# Patient Record
Sex: Female | Born: 2000 | Race: White | Hispanic: No | Marital: Single | Smoking: Current some day smoker
Health system: Southern US, Community
[De-identification: ages and names within clinical notes are randomized; demographics above are authoritative.]

## PROBLEM LIST (undated history)

## (undated) DIAGNOSIS — F419 Anxiety disorder, unspecified: Secondary | ICD-10-CM

## (undated) DIAGNOSIS — F909 Attention-deficit hyperactivity disorder, unspecified type: Secondary | ICD-10-CM

## (undated) DIAGNOSIS — K219 Gastro-esophageal reflux disease without esophagitis: Secondary | ICD-10-CM

## (undated) DIAGNOSIS — T7840XA Allergy, unspecified, initial encounter: Secondary | ICD-10-CM

## (undated) DIAGNOSIS — F3181 Bipolar II disorder: Secondary | ICD-10-CM

## (undated) HISTORY — PX: ADENOIDECTOMY: SUR15

## (undated) HISTORY — DX: Allergy, unspecified, initial encounter: T78.40XA

## (undated) HISTORY — PX: WISDOM TOOTH EXTRACTION: SHX21

## (undated) HISTORY — DX: Anxiety disorder, unspecified: F41.9

## (undated) HISTORY — DX: Attention-deficit hyperactivity disorder, unspecified type: F90.9

## (undated) HISTORY — DX: Bipolar II disorder: F31.81

## (undated) HISTORY — DX: Gastro-esophageal reflux disease without esophagitis: K21.9

---

## 2020-01-30 ENCOUNTER — Other Ambulatory Visit: Payer: Self-pay

## 2020-01-30 ENCOUNTER — Ambulatory Visit (INDEPENDENT_AMBULATORY_CARE_PROVIDER_SITE_OTHER): Payer: Managed Care, Other (non HMO) | Admitting: Family Medicine

## 2020-01-30 VITALS — BP 119/75 | HR 110 | Temp 97.3°F | Ht 64.0 in | Wt 200.0 lb

## 2020-01-30 DIAGNOSIS — K219 Gastro-esophageal reflux disease without esophagitis: Secondary | ICD-10-CM | POA: Insufficient documentation

## 2020-01-30 DIAGNOSIS — F3181 Bipolar II disorder: Secondary | ICD-10-CM

## 2020-01-30 DIAGNOSIS — K5904 Chronic idiopathic constipation: Secondary | ICD-10-CM

## 2020-01-30 DIAGNOSIS — J069 Acute upper respiratory infection, unspecified: Secondary | ICD-10-CM | POA: Diagnosis not present

## 2020-01-30 MED ORDER — OMEPRAZOLE 40 MG PO CPDR: 40.0000 mg | DELAYED_RELEASE_CAPSULE | Freq: Every day | ORAL | 3 refills | Status: DC

## 2020-01-30 MED ORDER — POLYETHYLENE GLYCOL 3350 17 GM/SCOOP PO POWD: 17.0000 g | Freq: Two times a day (BID) | ORAL | 5 refills | Status: DC | PRN

## 2020-01-30 NOTE — Progress Notes (Signed)
5/25/20211:45 PM  Tami Bauer 2001-02-06, 19 y.o., female [id number]  Chief Complaint  Patient presents with  . New Patient (Initial Visit)  . Nasal Congestion    fatigue, sinus headache  . Gastroesophageal Reflux    HPI:   Patient is a 19 y.o. 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  Completed covid vaccines, last dose April 8th 2021  Woke up this morning with nasal congestion and drainage, cough, sore throat, tender LN no fever or chills, no SOB, no abd pain, nausea, diarrhea No loss of sense of taste or smell Her partner had same sx a week ago  For past 6 months worsening GERD Keeps her up at night, sometimes needs to sleep upright Has done intermittent 2 week prevacid pack "I cant eat anything" Has vomited once Denies coffee ground or blood in emesis Denies any black tarry stools She has gained about 60 lbs during quarantine She does not smoke and does not drink etoh She rarely drinks coffee She denies any sign NSAIDs She reports stable weight Long standing h/o with constipation - requesting rx miralax She has been having issues with gerd since childhood (5years) Has never seen GI  Depression screen [facility] 2/9 01/30/2020  Decreased Interest 0  Down, Depressed, Hopeless 0  PHQ - 2 Score 0    Fall Risk  01/30/2020  Falls in the past year? 0  Number falls in past yr: 0  Injury with Fall? 0  Follow up Falls evaluation completed     Allergies  Allergen Reactions  . Penicillins Hives and Rash    Prior to Admission medications   Medication Sig Start Date End Date Taking? Authorizing Provider  cariprazine (VRAYLAR) capsule Take by mouth.   Yes [provider]  fluvoxaMINE  (LUVOX) 100 MG tablet Take 100 mg by mouth at bedtime.   Yes [provider]  lisdexamfetamine (VYVANSE) 40 MG capsule Take 40 mg by mouth every morning.   Yes [provider]  JUNEL 1/20 1-20 MG-MCG tablet Take 1 tablet by mouth daily. 01/24/20   [provider]    Past Medical History:  Diagnosis Date  . Allergy   . Anxiety    Phreesia 01/30/2020  . Bipolar 2 disorder ([place])   . GERD (gastroesophageal reflux disease)    Phreesia 01/30/2020    Past Surgical History:  Procedure Laterality Date  . ADENOIDECTOMY      Social History   Tobacco Use  . Smoking status: Never Smoker  . Smokeless tobacco: Never Used  Substance Use Topics  . Alcohol use: Yes    Alcohol/week: 1.0 standard drinks    Types: 1 Shots of liquor per week    Comment: weekly    Family History  Problem Relation Age of Onset  . Mental illness Mother   . Rheum arthritis Maternal Uncle   . Diabetes Maternal Grandmother   . Cancer Maternal Grandfather   . Cancer Paternal Grandmother   . Mental illness Paternal Grandfather     ROS Per hpi  OBJECTIVE:  Today's Vitals   01/30/20 1332  BP: 119/75  Pulse: (!) 110  Temp: (!) 97.3 F (36.3 C)  SpO2: 94%  Weight: 200 lb (90.7 kg)  Height: 5'  4" (1.626 m)   Body mass index is 34.33 kg/m.   Physical Exam Vitals and nursing note reviewed.  Constitutional:      Appearance: She is well-developed.  HENT:     Head: Normocephalic and atraumatic.     Right Ear: Hearing, tympanic membrane, ear canal and external ear normal.     Left Ear: Hearing, tympanic membrane, ear canal and external ear normal.     Mouth/Throat:     Pharynx: Posterior oropharyngeal erythema present. No oropharyngeal exudate.     Tonsils: No tonsillar exudate. 1+ on the right. 1+ on the left.  Eyes:     Extraocular Movements: Extraocular movements intact.     Conjunctiva/sclera: Conjunctivae normal.     Pupils: Pupils are equal, round, and reactive to  light.  Cardiovascular:     Rate and Rhythm: Normal rate and regular rhythm.     Heart sounds: Normal heart sounds. No murmur. No friction rub. No gallop.   Pulmonary:     Effort: Pulmonary effort is normal.     Breath sounds: Normal breath sounds. No wheezing or rales.  Musculoskeletal:     Cervical back: Neck supple.  Lymphadenopathy:     Cervical: No cervical adenopathy.  Skin:    General: Skin is warm and dry.  Neurological:     Mental Status: She is alert and oriented to person, place, and time.     No results found for this or any previous visit (from the past 24 hour(s)).  No results found.   ASSESSMENT and PLAN  1. Gastroesophageal reflux disease, unspecified whether esophagitis present Start PPI, cont dietary changes, consider GI referral. RTC precautions given.  2. Acute upper respiratory infection Discussed supportive measures for URI: increase hydration, rest, OTC medications, etc. RTC precautions discussed. Patient has completed covid vaccinations over a months ago  3. Chronic idiopathic constipation miralax prn  4. Bipolar 2 disorder (HCC) Stable. Managed by psychiatry  Other orders - omeprazole (PRILOSEC) 40 MG capsule; Take 1 capsule (40 mg total) by mouth daily. - polyethylene glycol powder (GLYCOLAX/MIRALAX) 17 GM/SCOOP powder; Take 17 g by mouth 2 (two) times daily as needed.  Return in about 4 weeks (around 02/27/2020).    Myles Lipps, MD Primary Care at [facility] [address] [place], [place] [address] Ph.  [phone] Fax [phone]

## 2020-01-30 NOTE — Patient Instructions (Signed)
° ° ° °  If you have lab work done today you will be contacted with your lab results within the next 2 weeks.  If you have not heard from us then please contact us. The fastest way to get your results is to register for My Chart. ° ° °IF you received an x-ray today, you will receive an invoice from [facility]. Please contact [facility] at [phone] with questions or concerns regarding your invoice.  ° °IF you received labwork today, you will receive an invoice from [facility]. Please contact [facility] at [phone] with questions or concerns regarding your invoice.  ° °Our billing staff will not be able to assist you with questions regarding bills from these companies. ° °You will be contacted with the lab results as soon as they are available. The fastest way to get your results is to activate your My Chart account. Instructions are located on the last page of this paperwork. If you have not heard from us regarding the results in 2 weeks, please contact this office. °  ° ° ° °

## 2020-03-04 ENCOUNTER — Encounter: Payer: Self-pay | Admitting: Family Medicine

## 2020-03-04 ENCOUNTER — Ambulatory Visit (INDEPENDENT_AMBULATORY_CARE_PROVIDER_SITE_OTHER): Payer: 59 | Admitting: Family Medicine

## 2020-03-04 ENCOUNTER — Other Ambulatory Visit: Payer: Self-pay

## 2020-03-04 VITALS — BP 110/73 | HR 84 | Temp 98.3°F | Ht 64.0 in | Wt 205.0 lb

## 2020-03-04 DIAGNOSIS — K5904 Chronic idiopathic constipation: Secondary | ICD-10-CM

## 2020-03-04 DIAGNOSIS — K219 Gastro-esophageal reflux disease without esophagitis: Secondary | ICD-10-CM | POA: Diagnosis not present

## 2020-03-04 DIAGNOSIS — F411 Generalized anxiety disorder: Secondary | ICD-10-CM

## 2020-03-04 DIAGNOSIS — F3181 Bipolar II disorder: Secondary | ICD-10-CM | POA: Diagnosis not present

## 2020-03-04 DIAGNOSIS — F988 Other specified behavioral and emotional disorders with onset usually occurring in childhood and adolescence: Secondary | ICD-10-CM

## 2020-03-04 MED ORDER — OMEPRAZOLE 40 MG PO CPDR: 40.0000 mg | DELAYED_RELEASE_CAPSULE | Freq: Every day | ORAL | 3 refills | Status: AC

## 2020-03-04 NOTE — Progress Notes (Signed)
6/28/20212:08 PM  [place] 08/26/2001, 19 y.o., female [id number]  Chief Complaint  Patient presents with  . GI Problem    says she is doing very well, meds are working fine. Needs refill on prilosec    HPI:   Patient is a 19 y.o. female with past medical history significant for  bipolar disorder, anxiety, ADD, gerd  who presents today for routine followup  Last OV may 2021 - started omeprazole for gerd  She is overall doing well Omeprazole is controlling well unless she eats very trigger related foods, she uses tums for flareups, which works well for her miralax does work for her constipation  She is requesting referral to psychiatry as would like to establish here in [place] Has been seeing provider in [place]/ area  Depression screen PHQ 2/9 01/30/2020  Decreased Interest 0  Down, Depressed, Hopeless 0  PHQ - 2 Score 0    Fall Risk  01/30/2020  Falls in the past year? 0  Number falls in past yr: 0  Injury with Fall? 0  Follow up Falls evaluation completed     Allergies  Allergen Reactions  . Penicillins Hives and Rash  . Escitalopram Oxalate     Hallucinations   . Fluoxetine     depression  . Hepatitis B Vaccine Other (See Comments)    Trouble walking  . Wheat Bran Other (See Comments)    Abdominal bloating, congestion  . Garlic Other (See Comments)    Mild congestion  . Sertraline Anxiety    And depression  . Sulfamethoxazole-Trimethoprim Rash    Prior to Admission medications   Medication Sig Start Date End Date Taking? Authorizing Provider  cariprazine (VRAYLAR) capsule Take by mouth.   Yes [provider]  fluvoxaMINE (LUVOX) 100 MG tablet Take 100 mg by mouth at bedtime.   Yes [provider]  JUNEL 1/20 1-20 MG-MCG tablet Take 1 tablet by mouth daily. 01/24/20  Yes [provider]  lisdexamfetamine (VYVANSE) 40 MG capsule Take 40 mg by mouth every morning.   Yes [provider]  omeprazole (PRILOSEC) 40  MG capsule Take 1 capsule (40 mg total) by mouth daily. 01/30/20  Yes Rutherford Guys, MD  polyethylene glycol powder (GLYCOLAX/MIRALAX) 17 GM/SCOOP powder Take 17 g by mouth 2 (two) times daily as needed. 01/30/20  Yes Rutherford Guys, MD    Past Medical History:  Diagnosis Date  . Allergy   . Anxiety    Phreesia 01/30/2020  . Bipolar 2 disorder ([place])   . GERD (gastroesophageal reflux disease)    Phreesia 01/30/2020    Past Surgical History:  Procedure Laterality Date  . ADENOIDECTOMY      Social History   Tobacco Use  . Smoking status: Never Smoker  . Smokeless tobacco: Never Used  Substance Use Topics  . Alcohol use: Yes    Alcohol/week: 1.0 standard drink    Types: 1 Shots of liquor per week    Comment: weekly    Family History  Problem Relation Age of Onset  . Mental illness Mother   . Rheum arthritis Maternal Uncle   . Diabetes Maternal Grandmother   . Leukemia Maternal Grandfather   . Breast cancer Paternal Grandmother   . Dementia Paternal Grandmother   . Breast cancer Paternal Grandfather   . Dementia Paternal Grandfather     ROS Per hpi  OBJECTIVE:  CWCBJ'S EGBTDV   03/04/20 1400  BP: 110/73  Pulse: 84  Temp: 98.3 F (36.8  C)  SpO2: 95%  Weight: 205 lb (93 kg)  Height: 5\' 4"  (1.626 m)   Body mass index is 35.19 kg/m.   Physical Exam Vitals and nursing note reviewed.  Constitutional:      Appearance: She is well-developed.  HENT:     Head: Normocephalic and atraumatic.  Eyes:     General: No scleral icterus.    Conjunctiva/sclera: Conjunctivae normal.     Pupils: Pupils are equal, round, and reactive to light.  Pulmonary:     Effort: Pulmonary effort is normal.  Musculoskeletal:     Cervical back: Neck supple.  Skin:    General: Skin is warm and dry.  Neurological:     Mental Status: She is alert and oriented to person, place, and time.     No results found for this or any previous visit (from the past 24 hour(s)).  No  results found.   ASSESSMENT and PLAN  1. Gastroesophageal reflux disease without esophagitis Controlled. Continue current regime.   2. Chronic idiopathic constipation Controlled. Continue current regime.   3. Bipolar 2 disorder (HCC) 4. Attention deficit disorder (ADD) without hyperactivity 5. GAD (generalized anxiety disorder) - Ambulatory referral to Psychiatry  Other orders - omeprazole (PRILOSEC) 40 MG capsule; Take 1 capsule (40 mg total) by mouth daily.  Return in about 1 year (around 03/04/2021).    03/06/2021, MD Primary Care at [facility] [address] [place], [place] Ph.  [phone] Fax [phone]

## 2020-03-04 NOTE — Patient Instructions (Addendum)
[facility] at [facility] Dr Evelene Croon   If you have lab work done today you will be contacted with your lab results within the next 2 weeks.  If you have not heard from [place] then please contact us. The fastest way to get your results is to register for My Chart.   IF you received an x-ray today, you will receive an invoice from [facility]. Please contact [facility] at [phone] with questions or concerns regarding your invoice.   IF you received labwork today, you will receive an invoice from [facility]. Please contact [facility] at [phone] with questions or concerns regarding your invoice.   Our billing staff will not be able to assist you with questions regarding bills from these companies.  You will be contacted with the lab results as soon as they are available. The fastest way to get your results is to activate your My Chart account. Instructions are located on the last page of this paperwork. If you have not heard from [place] regarding the results in 2 weeks, please contact this office.

## 2020-04-11 ENCOUNTER — Telehealth: Payer: 59 | Admitting: Family Medicine

## 2020-04-12 ENCOUNTER — Telehealth: Payer: Self-pay | Admitting: Family Medicine

## 2020-04-12 ENCOUNTER — Encounter: Payer: Self-pay | Admitting: Family Medicine

## 2020-04-12 ENCOUNTER — Other Ambulatory Visit: Payer: Self-pay

## 2020-04-12 ENCOUNTER — Telehealth (INDEPENDENT_AMBULATORY_CARE_PROVIDER_SITE_OTHER): Payer: 59 | Admitting: Family Medicine

## 2020-04-12 VITALS — Ht 64.0 in

## 2020-04-12 DIAGNOSIS — R112 Nausea with vomiting, unspecified: Secondary | ICD-10-CM | POA: Diagnosis not present

## 2020-04-12 MED ORDER — ONDANSETRON HCL 8 MG PO TABS: 8.0000 mg | ORAL_TABLET | Freq: Three times a day (TID) | ORAL | 0 refills | Status: AC | PRN

## 2020-04-12 MED ORDER — FAMOTIDINE 40 MG PO TABS: 40.0000 mg | ORAL_TABLET | Freq: Every day | ORAL | 3 refills | Status: DC

## 2020-04-12 NOTE — Progress Notes (Signed)
Virtual Visit Note  I connected with patient on 04/12/20 at 520pm by phone due to technical difficulties and verified that I am speaking with the correct person using two identifiers. Tami Bauer is currently located at home and patient is currently with them during visit. The provider, Myles Lipps, MD is located in their office at time of visit.  I discussed the limitations, risks, security and privacy concerns of performing an evaluation and management service by telephone and the availability of in person appointments. I also discussed with the patient that there may be a patient responsible charge related to this service. The patient expressed understanding and agreed to proceed.   I provided 10 minutes of non-face-to-face time during this encounter.  Chief Complaint  Patient presents with  . NAUSEA and Vomit in AM    going on a couple of weeks     HPI ? Patient reports waking up nauseous for past several weeks Sometimes she is also nauseous during the day She has had occ vomiting + bloating, burping, normal appetite - reflux, abd pain, melena, constipation or diarrhea Takes omeprazole 40mg  daily pepto prn helps Denies any urinary sx, fever or chills Sex with women only   Allergies  Allergen Reactions  . Penicillins Hives and Rash  . Escitalopram Oxalate     Hallucinations   . Fluoxetine     depression  . Hepatitis B Vaccine Other (See Comments)    Trouble walking  . Wheat Bran Other (See Comments)    Abdominal bloating, congestion  . Garlic Other (See Comments)    Mild congestion  . Sertraline Anxiety    And depression  . Sulfamethoxazole-Trimethoprim Rash    Prior to Admission medications   Medication Sig Start Date End Date Taking? Authorizing Provider  amphetamine-dextroamphetamine (ADDERALL) 10 MG tablet Take 10 mg by mouth daily. 03/14/20  Yes [provider]  cariprazine (VRAYLAR) capsule Take by mouth.   Yes [provider]    fluvoxaMINE (LUVOX) 100 MG tablet Take 100 mg by mouth at bedtime.   Yes [provider]  JUNEL 1/20 1-20 MG-MCG tablet Take 1 tablet by mouth daily. 01/24/20  Yes [provider]  lisdexamfetamine (VYVANSE) 40 MG capsule Take 40 mg by mouth every morning.   Yes [provider]  omeprazole (PRILOSEC) 40 MG capsule Take 1 capsule (40 mg total) by mouth daily. 03/04/20  Yes 03/06/20, MD  polyethylene glycol powder (GLYCOLAX/MIRALAX) 17 GM/SCOOP powder Take 17 g by mouth 2 (two) times daily as needed. 01/30/20  Yes 02/01/20, MD  famotidine (PEPCID) 40 MG tablet Take 1 tablet (40 mg total) by mouth at bedtime. 04/12/20   06/12/20, MD  ondansetron (ZOFRAN) 8 MG tablet Take 1 tablet (8 mg total) by mouth every 8 (eight) hours as needed for nausea or vomiting. 04/12/20   06/12/20, MD    Past Medical History:  Diagnosis Date  . Allergy   . Anxiety    Phreesia 01/30/2020  . Bipolar 2 disorder (HCC)   . GERD (gastroesophageal reflux disease)    Phreesia 01/30/2020    Past Surgical History:  Procedure Laterality Date  . ADENOIDECTOMY      Social History   Tobacco Use  . Smoking status: Never Smoker  . Smokeless tobacco: Never Used  Substance Use Topics  . Alcohol use: Yes    Alcohol/week: 1.0 standard drink    Types: 1 Shots of liquor per week  Comment: weekly    Family History  Problem Relation Age of Onset  . Mental illness Mother   . Rheum arthritis Maternal Uncle   . Diabetes Maternal Grandmother   . Leukemia Maternal Grandfather   . Breast cancer Paternal Grandmother   . Dementia Paternal Grandmother   . Breast cancer Paternal Grandfather   . Dementia Paternal Grandfather     ROS Per hpi  Objective  Vitals as reported by the patient: none   ASSESSMENT and PLAN  1. Nausea and vomiting, intractability of vomiting not specified, unspecified vomiting type No red flag symptoms. Favoring uncontrolled reflux. Adding  pepcid at bedtime. zofran prn. RTC precautions given.  Other orders - famotidine (PEPCID) 40 MG tablet; Take 1 tablet (40 mg total) by mouth at bedtime. - ondansetron (ZOFRAN) 8 MG tablet; Take 1 tablet (8 mg total) by mouth every 8 (eight) hours as needed for nausea or vomiting.  FOLLOW-UP: prn   The above assessment and management plan was discussed with the patient. The patient verbalized understanding of and has agreed to the management plan. Patient is aware to call the clinic if symptoms persist or worsen. Patient is aware when to return to the clinic for a follow-up visit. Patient educated on when it is appropriate to go to the emergency department.     Myles Lipps, MD Primary Care at [facility] [address] [place], [place] [address] Ph.  [phone] Fax [phone]

## 2020-04-12 NOTE — Telephone Encounter (Signed)
Patient is had problems logging into her MyChart. And was trying to get her appt before the office closed. Agent attempted to reach the office. The line was closed for the day [phone]

## 2020-04-12 NOTE — Patient Instructions (Signed)
° ° ° °  If you have lab work done today you will be contacted with your lab results within the next 2 weeks.  If you have not heard from us then please contact us. The fastest way to get your results is to register for My Chart. ° ° °IF you received an x-ray today, you will receive an invoice from [facility]. Please contact [facility] at [phone] with questions or concerns regarding your invoice.  ° °IF you received labwork today, you will receive an invoice from [facility]. Please contact [facility] at [phone] with questions or concerns regarding your invoice.  ° °Our billing staff will not be able to assist you with questions regarding bills from these companies. ° °You will be contacted with the lab results as soon as they are available. The fastest way to get your results is to activate your My Chart account. Instructions are located on the last page of this paperwork. If you have not heard from us regarding the results in 2 weeks, please contact this office. °  ° ° ° °

## 2020-04-15 NOTE — Telephone Encounter (Signed)
Pt is trying to make appt via mychart, having complications. Please assist.

## 2020-04-15 NOTE — Telephone Encounter (Signed)
Called pt and she stated she had figured it out.

## 2020-05-08 ENCOUNTER — Other Ambulatory Visit: Payer: Self-pay

## 2020-05-08 ENCOUNTER — Emergency Department (HOSPITAL_COMMUNITY): Payer: 59

## 2020-05-08 ENCOUNTER — Encounter (HOSPITAL_COMMUNITY): Payer: Self-pay

## 2020-05-08 ENCOUNTER — Inpatient Hospital Stay (HOSPITAL_COMMUNITY)
Admission: EM | Admit: 2020-05-08 | Discharge: 2020-05-12 | DRG: 516 | Disposition: A | Discharge status: Home Health | Payer: 59 | Attending: Student | Admitting: Student

## 2020-05-08 DIAGNOSIS — F419 Anxiety disorder, unspecified: Secondary | ICD-10-CM | POA: Diagnosis present

## 2020-05-08 DIAGNOSIS — S3210XA Unspecified fracture of sacrum, initial encounter for closed fracture: Secondary | ICD-10-CM | POA: Diagnosis not present

## 2020-05-08 DIAGNOSIS — E669 Obesity, unspecified: Secondary | ICD-10-CM | POA: Diagnosis present

## 2020-05-08 DIAGNOSIS — Z887 Allergy status to serum and vaccine status: Secondary | ICD-10-CM | POA: Diagnosis not present

## 2020-05-08 DIAGNOSIS — Z882 Allergy status to sulfonamides status: Secondary | ICD-10-CM | POA: Diagnosis not present

## 2020-05-08 DIAGNOSIS — K219 Gastro-esophageal reflux disease without esophagitis: Secondary | ICD-10-CM | POA: Diagnosis present

## 2020-05-08 DIAGNOSIS — Z20822 Contact with and (suspected) exposure to covid-19: Secondary | ICD-10-CM | POA: Diagnosis present

## 2020-05-08 DIAGNOSIS — Z806 Family history of leukemia: Secondary | ICD-10-CM

## 2020-05-08 DIAGNOSIS — Y92410 Unspecified street and highway as the place of occurrence of the external cause: Secondary | ICD-10-CM | POA: Diagnosis not present

## 2020-05-08 DIAGNOSIS — D62 Acute posthemorrhagic anemia: Secondary | ICD-10-CM | POA: Diagnosis not present

## 2020-05-08 DIAGNOSIS — Z888 Allergy status to other drugs, medicaments and biological substances status: Secondary | ICD-10-CM

## 2020-05-08 DIAGNOSIS — S32592A Other specified fracture of left pubis, initial encounter for closed fracture: Secondary | ICD-10-CM | POA: Diagnosis present

## 2020-05-08 DIAGNOSIS — Z820 Family history of epilepsy and other diseases of the nervous system: Secondary | ICD-10-CM

## 2020-05-08 DIAGNOSIS — S32119A Unspecified Zone I fracture of sacrum, initial encounter for closed fracture: Secondary | ICD-10-CM | POA: Diagnosis present

## 2020-05-08 DIAGNOSIS — S329XXA Fracture of unspecified parts of lumbosacral spine and pelvis, initial encounter for closed fracture: Secondary | ICD-10-CM

## 2020-05-08 DIAGNOSIS — Z818 Family history of other mental and behavioral disorders: Secondary | ICD-10-CM | POA: Diagnosis not present

## 2020-05-08 DIAGNOSIS — Z803 Family history of malignant neoplasm of breast: Secondary | ICD-10-CM

## 2020-05-08 DIAGNOSIS — S32512A Fracture of superior rim of left pubis, initial encounter for closed fracture: Secondary | ICD-10-CM

## 2020-05-08 DIAGNOSIS — F3181 Bipolar II disorder: Secondary | ICD-10-CM | POA: Diagnosis present

## 2020-05-08 DIAGNOSIS — T1490XA Injury, unspecified, initial encounter: Secondary | ICD-10-CM

## 2020-05-08 DIAGNOSIS — K5900 Constipation, unspecified: Secondary | ICD-10-CM | POA: Diagnosis not present

## 2020-05-08 DIAGNOSIS — Z880 Allergy status to penicillin: Secondary | ICD-10-CM | POA: Diagnosis not present

## 2020-05-08 DIAGNOSIS — Z6835 Body mass index (BMI) 35.0-35.9, adult: Secondary | ICD-10-CM | POA: Diagnosis not present

## 2020-05-08 DIAGNOSIS — Z833 Family history of diabetes mellitus: Secondary | ICD-10-CM

## 2020-05-08 LAB — HIV ANTIBODY (ROUTINE TESTING W REFLEX): HIV Screen 4th Generation wRfx: NONREACTIVE

## 2020-05-08 LAB — CBC WITH DIFFERENTIAL/PLATELET
Abs Immature Granulocytes: 0.29 10*3/uL — ABNORMAL HIGH (ref 0.00–0.07)
Basophils Absolute: 0.1 10*3/uL (ref 0.0–0.1)
Basophils Relative: 0 %
Eosinophils Absolute: 0.1 10*3/uL (ref 0.0–0.5)
Eosinophils Relative: 0 %
HCT: 35.9 % — ABNORMAL LOW (ref 36.0–46.0)
Hemoglobin: 11.7 g/dL — ABNORMAL LOW (ref 12.0–15.0)
Immature Granulocytes: 2 %
Lymphocytes Relative: 14 %
Lymphs Abs: 2.6 10*3/uL (ref 0.7–4.0)
MCH: 27.9 pg (ref 26.0–34.0)
MCHC: 32.6 g/dL (ref 30.0–36.0)
MCV: 85.7 fL (ref 80.0–100.0)
Monocytes Absolute: 0.8 10*3/uL (ref 0.1–1.0)
Monocytes Relative: 5 %
Neutro Abs: 14.3 10*3/uL — ABNORMAL HIGH (ref 1.7–7.7)
Neutrophils Relative %: 79 %
Platelets: 361 10*3/uL (ref 150–400)
RBC: 4.19 MIL/uL (ref 3.87–5.11)
RDW: 14.1 % (ref 11.5–15.5)
WBC: 18.1 10*3/uL — ABNORMAL HIGH (ref 4.0–10.5)
nRBC: 0 % (ref 0.0–0.2)

## 2020-05-08 LAB — COMPREHENSIVE METABOLIC PANEL
ALT: 19 U/L (ref 0–44)
AST: 27 U/L (ref 15–41)
Albumin: 3.1 g/dL — ABNORMAL LOW (ref 3.5–5.0)
Alkaline Phosphatase: 86 U/L (ref 38–126)
Anion gap: 10 (ref 5–15)
BUN: 12 mg/dL (ref 6–20)
CO2: 22 mmol/L (ref 22–32)
Calcium: 8.8 mg/dL — ABNORMAL LOW (ref 8.9–10.3)
Chloride: 104 mmol/L (ref 98–111)
Creatinine, Ser: 0.69 mg/dL (ref 0.44–1.00)
GFR calc Af Amer: >60 mL/min (ref >60)
GFR calc non Af Amer: >60 mL/min (ref >60)
Glucose, Bld: 134 mg/dL — ABNORMAL HIGH (ref 70–99)
Potassium: 3.6 mmol/L (ref 3.5–5.1)
Sodium: 136 mmol/L (ref 135–145)
Total Bilirubin: 0.3 mg/dL (ref 0.3–1.2)
Total Protein: 6.4 g/dL — ABNORMAL LOW (ref 6.5–8.1)

## 2020-05-08 LAB — I-STAT BETA HCG BLOOD, ED (MC, WL, AP ONLY): I-stat hCG, quantitative: <5 m[IU]/mL (ref <5)

## 2020-05-08 LAB — SARS CORONAVIRUS 2 BY RT PCR (HOSPITAL ORDER, PERFORMED IN ~~LOC~~ HOSPITAL LAB): SARS Coronavirus 2: NEGATIVE

## 2020-05-08 MED ORDER — PANTOPRAZOLE SODIUM 40 MG PO TBEC
40.0000 mg | DELAYED_RELEASE_TABLET | Freq: Every day | ORAL | Status: DC
  Administered 2020-05-08 – 2020-05-12 (×4): 40 mg via ORAL
  Filled 2020-05-08 (×4): qty 1

## 2020-05-08 MED ORDER — CLINDAMYCIN HCL 150 MG PO CAPS
300.0000 mg | ORAL_CAPSULE | Freq: Once | ORAL | Status: AC
  Administered 2020-05-08: 300 mg via ORAL
  Filled 2020-05-08: qty 2

## 2020-05-08 MED ORDER — MORPHINE SULFATE (PF) 2 MG/ML IV SOLN
2.0000 mg | INTRAVENOUS | Status: DC | PRN
  Administered 2020-05-08: 2 mg via INTRAVENOUS
  Filled 2020-05-08: qty 1

## 2020-05-08 MED ORDER — VANCOMYCIN HCL IN DEXTROSE 1-5 GM/200ML-% IV SOLN
1000.0000 mg | INTRAVENOUS | Status: AC
  Administered 2020-05-09: 1000 mg via INTRAVENOUS
  Filled 2020-05-08: qty 200

## 2020-05-08 MED ORDER — ONDANSETRON HCL 4 MG PO TABS: 4.0000 mg | ORAL_TABLET | Freq: Four times a day (QID) | ORAL | Status: DC | PRN

## 2020-05-08 MED ORDER — MUPIROCIN 2 % EX OINT
1.0000 "application " | TOPICAL_OINTMENT | Freq: Two times a day (BID) | CUTANEOUS | Status: AC
  Administered 2020-05-09 – 2020-05-10 (×3): 1 via NASAL
  Filled 2020-05-08: qty 22

## 2020-05-08 MED ORDER — ENSURE PRE-SURGERY PO LIQD
296.0000 mL | Freq: Once | ORAL | Status: DC
  Filled 2020-05-08: qty 296

## 2020-05-08 MED ORDER — CARIPRAZINE HCL 1.5 MG PO CAPS
1.5000 mg | ORAL_CAPSULE | Freq: Every day | ORAL | Status: DC
  Administered 2020-05-10 – 2020-05-12 (×3): 1.5 mg via ORAL
  Filled 2020-05-08 (×3): qty 1

## 2020-05-08 MED ORDER — POVIDONE-IODINE 10 % EX SWAB: 2.0000 "application " | Freq: Once | CUTANEOUS | Status: DC

## 2020-05-08 MED ORDER — OXYCODONE HCL 5 MG PO TABS
5.0000 mg | ORAL_TABLET | ORAL | Status: DC | PRN
  Administered 2020-05-09: 15 mg via ORAL
  Administered 2020-05-09: 10 mg via ORAL
  Administered 2020-05-10: 15 mg via ORAL
  Administered 2020-05-11: 10 mg via ORAL
  Administered 2020-05-11 (×2): 15 mg via ORAL
  Filled 2020-05-08 (×2): qty 2
  Filled 2020-05-08 (×4): qty 3
  Filled 2020-05-08: qty 2
  Filled 2020-05-08: qty 3

## 2020-05-08 MED ORDER — METHOCARBAMOL 1000 MG/10ML IJ SOLN
500.0000 mg | Freq: Four times a day (QID) | INTRAVENOUS | Status: DC | PRN
  Filled 2020-05-08: qty 5

## 2020-05-08 MED ORDER — ONDANSETRON HCL 4 MG/2ML IJ SOLN: 4.0000 mg | Freq: Four times a day (QID) | INTRAMUSCULAR | Status: DC | PRN

## 2020-05-08 MED ORDER — ENOXAPARIN SODIUM 40 MG/0.4ML ~~LOC~~ SOLN
40.0000 mg | SUBCUTANEOUS | Status: DC
  Administered 2020-05-08: 40 mg via SUBCUTANEOUS
  Filled 2020-05-08: qty 0.4

## 2020-05-08 MED ORDER — METHOCARBAMOL 500 MG PO TABS
500.0000 mg | ORAL_TABLET | Freq: Four times a day (QID) | ORAL | Status: DC | PRN
  Administered 2020-05-09 – 2020-05-11 (×3): 500 mg via ORAL
  Filled 2020-05-08 (×4): qty 1

## 2020-05-08 MED ORDER — HYDROMORPHONE HCL 1 MG/ML IJ SOLN
1.0000 mg | Freq: Once | INTRAMUSCULAR | Status: AC
  Administered 2020-05-08: 1 mg via INTRAVENOUS
  Filled 2020-05-08: qty 1

## 2020-05-08 MED ORDER — IOHEXOL 300 MG/ML  SOLN
100.0000 mL | Freq: Once | INTRAMUSCULAR | Status: AC | PRN
  Administered 2020-05-08: 100 mL via INTRAVENOUS

## 2020-05-08 MED ORDER — NORETHINDRONE ACET-ETHINYL EST 1-20 MG-MCG PO TABS
1.0000 | ORAL_TABLET | Freq: Every day | ORAL | Status: DC
  Administered 2020-05-08: 1 via ORAL

## 2020-05-08 MED ORDER — CHLORHEXIDINE GLUCONATE 4 % EX LIQD: 60.0000 mL | Freq: Once | CUTANEOUS | Status: DC

## 2020-05-08 MED ORDER — FLUVOXAMINE MALEATE 50 MG PO TABS
150.0000 mg | ORAL_TABLET | Freq: Every day | ORAL | Status: DC
  Administered 2020-05-08 – 2020-05-11 (×4): 150 mg via ORAL
  Filled 2020-05-08 (×5): qty 1

## 2020-05-08 NOTE — ED Notes (Signed)
Patient transported to CT 
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
## 2020-05-09 ENCOUNTER — Inpatient Hospital Stay (HOSPITAL_COMMUNITY): Payer: 59

## 2020-05-09 ENCOUNTER — Inpatient Hospital Stay (HOSPITAL_COMMUNITY): Payer: 59 | Admitting: Anesthesiology

## 2020-05-09 ENCOUNTER — Encounter (HOSPITAL_COMMUNITY): Payer: Self-pay | Admitting: Student

## 2020-05-09 ENCOUNTER — Encounter (HOSPITAL_COMMUNITY)
Admission: EM | Disposition: A | Discharge status: Home Health | Payer: Self-pay | Source: Home / Self Care | Attending: Student

## 2020-05-09 DIAGNOSIS — S32512A Fracture of superior rim of left pubis, initial encounter for closed fracture: Secondary | ICD-10-CM

## 2020-05-09 HISTORY — PX: SACRO-ILIAC PINNING: SHX5050

## 2020-05-09 LAB — CBC
HCT: 34.7 % — ABNORMAL LOW (ref 36.0–46.0)
Hemoglobin: 11.4 g/dL — ABNORMAL LOW (ref 12.0–15.0)
MCH: 27.5 pg (ref 26.0–34.0)
MCHC: 32.9 g/dL (ref 30.0–36.0)
MCV: 83.6 fL (ref 80.0–100.0)
Platelets: 326 10*3/uL (ref 150–400)
RBC: 4.15 MIL/uL (ref 3.87–5.11)
RDW: 14.5 % (ref 11.5–15.5)
WBC: 10.3 10*3/uL (ref 4.0–10.5)
nRBC: 0 % (ref 0.0–0.2)

## 2020-05-09 SURGERY — PINNING, SACROILIAC JOINT, PERCUTANEOUS
Anesthesia: General | Laterality: Left

## 2020-05-09 MED ORDER — SCOPOLAMINE 1 MG/3DAYS TD PT72
MEDICATED_PATCH | TRANSDERMAL | Status: DC | PRN
  Administered 2020-05-09: 1 via TRANSDERMAL

## 2020-05-09 MED ORDER — KETOROLAC TROMETHAMINE 30 MG/ML IJ SOLN: 30.0000 mg | Freq: Once | INTRAMUSCULAR | Status: AC | PRN

## 2020-05-09 MED ORDER — CHLORHEXIDINE GLUCONATE 0.12 % MT SOLN
OROMUCOSAL | Status: AC
  Administered 2020-05-09: 15 mL via OROMUCOSAL
  Filled 2020-05-09: qty 15

## 2020-05-09 MED ORDER — MEPERIDINE HCL 25 MG/ML IJ SOLN: 6.2500 mg | INTRAMUSCULAR | Status: DC | PRN

## 2020-05-09 MED ORDER — FENTANYL CITRATE (PF) 100 MCG/2ML IJ SOLN
INTRAMUSCULAR | Status: DC | PRN
Start: 2020-05-09 — End: 2020-05-09
  Administered 2020-05-09 (×2): 50 ug via INTRAVENOUS
  Administered 2020-05-09: 100 ug via INTRAVENOUS
  Administered 2020-05-09: 50 ug via INTRAVENOUS

## 2020-05-09 MED ORDER — ACETAMINOPHEN 325 MG PO TABS
650.0000 mg | ORAL_TABLET | Freq: Four times a day (QID) | ORAL | Status: DC
  Administered 2020-05-09 – 2020-05-12 (×11): 650 mg via ORAL
  Filled 2020-05-09 (×10): qty 2

## 2020-05-09 MED ORDER — PROMETHAZINE HCL 25 MG/ML IJ SOLN: 6.2500 mg | INTRAMUSCULAR | Status: DC | PRN

## 2020-05-09 MED ORDER — FENTANYL CITRATE (PF) 250 MCG/5ML IJ SOLN
INTRAMUSCULAR | Status: AC
Start: 2020-05-09 — End: ?
  Filled 2020-05-09: qty 5

## 2020-05-09 MED ORDER — CHLORHEXIDINE GLUCONATE 0.12 % MT SOLN: 15.0000 mL | Freq: Once | OROMUCOSAL | Status: AC

## 2020-05-09 MED ORDER — VANCOMYCIN HCL 1000 MG IV SOLR
INTRAVENOUS | Status: DC | PRN
  Administered 2020-05-09: 1000 mg

## 2020-05-09 MED ORDER — ONDANSETRON HCL 4 MG/2ML IJ SOLN
INTRAMUSCULAR | Status: AC
  Filled 2020-05-09: qty 2

## 2020-05-09 MED ORDER — ROCURONIUM BROMIDE 10 MG/ML (PF) SYRINGE
PREFILLED_SYRINGE | INTRAVENOUS | Status: DC | PRN
  Administered 2020-05-09: 20 mg via INTRAVENOUS
  Administered 2020-05-09: 80 mg via INTRAVENOUS

## 2020-05-09 MED ORDER — PROPOFOL 10 MG/ML IV BOLUS
INTRAVENOUS | Status: DC | PRN
  Administered 2020-05-09: 150 mg via INTRAVENOUS

## 2020-05-09 MED ORDER — OXYCODONE HCL 5 MG/5ML PO SOLN: 5.0000 mg | Freq: Once | ORAL | Status: DC | PRN

## 2020-05-09 MED ORDER — DEXAMETHASONE SODIUM PHOSPHATE 10 MG/ML IJ SOLN
INTRAMUSCULAR | Status: DC | PRN
  Administered 2020-05-09: 10 mg via INTRAVENOUS

## 2020-05-09 MED ORDER — ACETAMINOPHEN 500 MG PO TABS
1000.0000 mg | ORAL_TABLET | Freq: Once | ORAL | Status: AC
  Administered 2020-05-09: 1000 mg via ORAL
  Filled 2020-05-09: qty 2

## 2020-05-09 MED ORDER — KETOROLAC TROMETHAMINE 30 MG/ML IJ SOLN
INTRAMUSCULAR | Status: AC
  Administered 2020-05-09: 30 mg via INTRAVENOUS
  Filled 2020-05-09: qty 1

## 2020-05-09 MED ORDER — PROPOFOL 10 MG/ML IV BOLUS
INTRAVENOUS | Status: AC
  Filled 2020-05-09: qty 20

## 2020-05-09 MED ORDER — OXYCODONE HCL 5 MG PO TABS: 5.0000 mg | ORAL_TABLET | Freq: Once | ORAL | Status: DC | PRN

## 2020-05-09 MED ORDER — LIDOCAINE 2% (20 MG/ML) 5 ML SYRINGE
INTRAMUSCULAR | Status: DC | PRN
  Administered 2020-05-09: 80 mg via INTRAVENOUS

## 2020-05-09 MED ORDER — DEXMEDETOMIDINE (PRECEDEX) IN NS 20 MCG/5ML (4 MCG/ML) IV SYRINGE
PREFILLED_SYRINGE | INTRAVENOUS | Status: AC
  Filled 2020-05-09: qty 5

## 2020-05-09 MED ORDER — SCOPOLAMINE 1 MG/3DAYS TD PT72
MEDICATED_PATCH | TRANSDERMAL | Status: AC
  Filled 2020-05-09: qty 1

## 2020-05-09 MED ORDER — LIDOCAINE 2% (20 MG/ML) 5 ML SYRINGE
INTRAMUSCULAR | Status: AC
  Filled 2020-05-09: qty 5

## 2020-05-09 MED ORDER — HYDROMORPHONE HCL 1 MG/ML IJ SOLN
0.2500 mg | INTRAMUSCULAR | Status: DC | PRN
  Administered 2020-05-09: 0.5 mg via INTRAVENOUS

## 2020-05-09 MED ORDER — LACTATED RINGERS IV SOLN: INTRAVENOUS | Status: DC

## 2020-05-09 MED ORDER — DEXMEDETOMIDINE (PRECEDEX) IN NS 20 MCG/5ML (4 MCG/ML) IV SYRINGE
PREFILLED_SYRINGE | INTRAVENOUS | Status: DC | PRN
  Administered 2020-05-09: 8 ug via INTRAVENOUS

## 2020-05-09 MED ORDER — FENTANYL CITRATE (PF) 250 MCG/5ML IJ SOLN
INTRAMUSCULAR | Status: AC
  Filled 2020-05-09: qty 5

## 2020-05-09 MED ORDER — CLINDAMYCIN PHOSPHATE 900 MG/50ML IV SOLN
900.0000 mg | Freq: Three times a day (TID) | INTRAVENOUS | Status: AC
  Administered 2020-05-09 – 2020-05-10 (×3): 900 mg via INTRAVENOUS
  Filled 2020-05-09 (×5): qty 50

## 2020-05-09 MED ORDER — VANCOMYCIN HCL 1000 MG IV SOLR
INTRAVENOUS | Status: AC
  Filled 2020-05-09: qty 1000

## 2020-05-09 MED ORDER — MIDAZOLAM HCL 2 MG/2ML IJ SOLN
INTRAMUSCULAR | Status: AC
  Filled 2020-05-09: qty 2

## 2020-05-09 MED ORDER — HYDROMORPHONE HCL 1 MG/ML IJ SOLN
INTRAMUSCULAR | Status: AC
  Administered 2020-05-09: 0.5 mg via INTRAVENOUS
  Filled 2020-05-09: qty 1

## 2020-05-09 MED ORDER — GABAPENTIN 100 MG PO CAPS
100.0000 mg | ORAL_CAPSULE | Freq: Three times a day (TID) | ORAL | Status: DC
  Administered 2020-05-09 – 2020-05-12 (×9): 100 mg via ORAL
  Filled 2020-05-09 (×9): qty 1

## 2020-05-09 MED ORDER — MIDAZOLAM HCL 5 MG/5ML IJ SOLN
INTRAMUSCULAR | Status: DC | PRN
  Administered 2020-05-09: 2 mg via INTRAVENOUS

## 2020-05-09 MED ORDER — ENOXAPARIN SODIUM 40 MG/0.4ML ~~LOC~~ SOLN
40.0000 mg | SUBCUTANEOUS | Status: DC
  Administered 2020-05-10 – 2020-05-12 (×3): 40 mg via SUBCUTANEOUS
  Filled 2020-05-09 (×4): qty 0.4

## 2020-05-09 MED ORDER — DEXAMETHASONE SODIUM PHOSPHATE 10 MG/ML IJ SOLN
INTRAMUSCULAR | Status: AC
  Filled 2020-05-09: qty 1

## 2020-05-09 MED ORDER — 0.9 % SODIUM CHLORIDE (POUR BTL) OPTIME
TOPICAL | Status: DC | PRN
  Administered 2020-05-09: 1000 mL

## 2020-05-09 MED ORDER — ROCURONIUM BROMIDE 10 MG/ML (PF) SYRINGE
PREFILLED_SYRINGE | INTRAVENOUS | Status: AC
  Filled 2020-05-09: qty 10

## 2020-05-09 SURGICAL SUPPLY — 46 items
BIT DRILL CANN 4.5MM (BIT) ×1 IMPLANT
BRUSH SCRUB EZ PLAIN DRY (MISCELLANEOUS) ×4 IMPLANT
CHLORAPREP W/TINT 26 (MISCELLANEOUS) ×2 IMPLANT
COVER SURGICAL LIGHT HANDLE (MISCELLANEOUS) ×2 IMPLANT
COVER WAND RF STERILE (DRAPES) ×2 IMPLANT
DERMABOND ADVANCED (GAUZE/BANDAGES/DRESSINGS) ×1
DERMABOND ADVANCED .7 DNX12 (GAUZE/BANDAGES/DRESSINGS) ×1 IMPLANT
DRAPE C-ARM 42X72 X-RAY (DRAPES) IMPLANT
DRAPE C-ARMOR (DRAPES) ×2 IMPLANT
DRAPE INCISE IOBAN 66X45 STRL (DRAPES) ×2 IMPLANT
DRAPE LAPAROTOMY TRNSV 102X78 (DRAPES) ×2 IMPLANT
DRAPE U-SHAPE 47X51 STRL (DRAPES) ×2 IMPLANT
DRILL BIT CANN 4.5MM (BIT) ×1
DRSG MEPILEX BORDER 4X4 (GAUZE/BANDAGES/DRESSINGS) ×2 IMPLANT
DRSG MEPILEX BORDER 4X8 (GAUZE/BANDAGES/DRESSINGS) ×2 IMPLANT
ELECT REM PT RETURN 9FT ADLT (ELECTROSURGICAL) ×2
ELECTRODE REM PT RTRN 9FT ADLT (ELECTROSURGICAL) ×1 IMPLANT
GLOVE BIO SURGEON STRL SZ 6.5 (GLOVE) ×6 IMPLANT
GLOVE BIO SURGEON STRL SZ7.5 (GLOVE) ×8 IMPLANT
GLOVE BIOGEL PI IND STRL 6.5 (GLOVE) ×1 IMPLANT
GLOVE BIOGEL PI IND STRL 7.5 (GLOVE) ×1 IMPLANT
GLOVE BIOGEL PI INDICATOR 6.5 (GLOVE) ×1
GLOVE BIOGEL PI INDICATOR 7.5 (GLOVE) ×1
GOWN STRL REUS W/ TWL LRG LVL3 (GOWN DISPOSABLE) ×2 IMPLANT
GOWN STRL REUS W/TWL LRG LVL3 (GOWN DISPOSABLE) ×2
GUIDEWIRE 2.0MM (WIRE) ×6 IMPLANT
GUIDEWIRE THREADED 2.8MM (WIRE) ×6 IMPLANT
KIT BASIN OR (CUSTOM PROCEDURE TRAY) ×2 IMPLANT
KIT TURNOVER KIT B (KITS) ×2 IMPLANT
MANIFOLD NEPTUNE II (INSTRUMENTS) ×2 IMPLANT
NS IRRIG 1000ML POUR BTL (IV SOLUTION) ×2 IMPLANT
PACK GENERAL/GYN (CUSTOM PROCEDURE TRAY) ×2 IMPLANT
PAD ARMBOARD 7.5X6 YLW CONV (MISCELLANEOUS) ×4 IMPLANT
SCREW  LOCK CANN 6.5X130 (Screw) ×1 IMPLANT
SCREW CANN 6.5X120X32 (Screw) ×2 IMPLANT
SCREW CANN FT SD 6.5X85 (Screw) ×2 IMPLANT
SCREW LOCK CANN 6.5X130 (Screw) ×1 IMPLANT
STAPLER VISISTAT 35W (STAPLE) ×2 IMPLANT
SUT ETHILON 3 0 PS 1 (SUTURE) ×2 IMPLANT
SUT MNCRL AB 3-0 PS2 18 (SUTURE) ×2 IMPLANT
SUT VIC AB 2-0 FS1 27 (SUTURE) ×2 IMPLANT
TOWEL GREEN STERILE (TOWEL DISPOSABLE) ×4 IMPLANT
TOWEL GREEN STERILE FF (TOWEL DISPOSABLE) ×2 IMPLANT
UNDERPAD 30X36 HEAVY ABSORB (UNDERPADS AND DIAPERS) ×2 IMPLANT
WASHER FOR 5.0 SCREWS (Washer) ×4 IMPLANT
WATER STERILE IRR 1000ML POUR (IV SOLUTION) ×2 IMPLANT

## 2020-05-09 NOTE — Transfer of Care (Signed)
Immediate Anesthesia Transfer of Care Note  Patient: Tami Bauer  Procedure(s) Performed: SACRO-ILIAC PINNING (Left )  Patient Location: PACU  Anesthesia Type:General  Level of Consciousness: oriented, drowsy and patient cooperative  Airway & Oxygen Therapy: Patient Spontanous Breathing and Patient connected to nasal cannula oxygen  Post-op Assessment: Report given to RN and Post -op Vital signs reviewed and stable  Post vital signs: Reviewed  Last Vitals:  Vitals Value Taken Time  BP    Temp    Pulse    Resp    SpO2      Last Pain:  Vitals:   05/09/20 0951  TempSrc: Oral  PainSc:          Complications: No complications documented.

## 2020-05-09 NOTE — Anesthesia Postprocedure Evaluation (Signed)
Anesthesia Post Note  Patient: Tami Bauer  Procedure(s) Performed: SACRO-ILIAC PINNING (Left )     Patient location during evaluation: PACU Anesthesia Type: General Level of consciousness: awake Pain management: pain level controlled Vital Signs Assessment: post-procedure vital signs reviewed and stable Respiratory status: spontaneous breathing, nonlabored ventilation, respiratory function stable and patient connected to nasal cannula oxygen Cardiovascular status: blood pressure returned to baseline and stable Postop Assessment: no apparent nausea or vomiting Anesthetic complications: no   No complications documented.  Last Vitals:  Vitals:   05/09/20 1520 05/09/20 1535  BP: 128/72 126/76  Pulse: 90 89  Resp: 18 19  Temp:  36.9 C  SpO2: 96% 97%    Last Pain:  Vitals:   05/09/20 1520  TempSrc:   PainSc: Asleep                 Mellody Dance
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
## 2020-05-09 NOTE — Anesthesia Procedure Notes (Signed)
Procedure Name: Intubation Date/Time: 05/09/2020 12:33 PM Performed by: Lovie Chol, CRNA Pre-anesthesia Checklist: Patient identified, Emergency Drugs available, Suction available and Patient being monitored Patient Re-evaluated:Patient Re-evaluated prior to induction Oxygen Delivery Method: Circle System Utilized Preoxygenation: Pre-oxygenation with 100% oxygen Induction Type: IV induction Ventilation: Mask ventilation without difficulty Laryngoscope Size: Miller Grade View: Grade I Tube type: Oral Tube size: 7.0 mm Number of attempts: 1 Airway Equipment and Method: Stylet and Oral airway Placement Confirmation: ETT inserted through vocal cords under direct vision,  positive ETCO2 and breath sounds checked- equal and bilateral Secured at: 21 cm Tube secured with: Tape Dental Injury: Teeth and Oropharynx as per pre-operative assessment

## 2020-05-10 ENCOUNTER — Encounter (HOSPITAL_COMMUNITY): Payer: Self-pay | Admitting: Student

## 2020-05-10 LAB — CBC
HCT: 32 % — ABNORMAL LOW (ref 36.0–46.0)
Hemoglobin: 10.3 g/dL — ABNORMAL LOW (ref 12.0–15.0)
MCH: 27.3 pg (ref 26.0–34.0)
MCHC: 32.2 g/dL (ref 30.0–36.0)
MCV: 84.9 fL (ref 80.0–100.0)
Platelets: 315 10*3/uL (ref 150–400)
RBC: 3.77 MIL/uL — ABNORMAL LOW (ref 3.87–5.11)
RDW: 14.6 % (ref 11.5–15.5)
WBC: 10.6 10*3/uL — ABNORMAL HIGH (ref 4.0–10.5)
nRBC: 0 % (ref 0.0–0.2)

## 2020-05-10 LAB — VITAMIN D 25 HYDROXY (VIT D DEFICIENCY, FRACTURES): Vit D, 25-Hydroxy: 28.26 ng/mL — ABNORMAL LOW (ref 30–100)

## 2020-05-10 MED ORDER — NORETHINDRONE ACET-ETHINYL EST 1-20 MG-MCG PO TABS
1.0000 | ORAL_TABLET | Freq: Every day | ORAL | Status: AC
  Administered 2020-05-10 – 2020-05-11 (×2): 1 via ORAL

## 2020-05-10 MED ORDER — OXYCODONE HCL 5 MG PO TABS
5.0000 mg | ORAL_TABLET | ORAL | 0 refills | Status: DC | PRN
Start: 2020-05-10 — End: 2020-10-29

## 2020-05-10 MED ORDER — METHOCARBAMOL 500 MG PO TABS: 500.0000 mg | ORAL_TABLET | Freq: Four times a day (QID) | ORAL | 0 refills | Status: DC | PRN

## 2020-05-10 MED ORDER — ENOXAPARIN SODIUM 40 MG/0.4ML ~~LOC~~ SOLN: 40.0000 mg | SUBCUTANEOUS | 0 refills | Status: DC

## 2020-05-10 MED ORDER — FAMOTIDINE 40 MG PO TABS: 40.0000 mg | ORAL_TABLET | Freq: Every day | ORAL | 3 refills | Status: DC

## 2020-05-10 NOTE — TOC CAGE-AID Note (Signed)
Transition of Care [facility]) - CAGE-AID Screening   Patient Details  Name: [name] MRN: [id number] Date of Birth: 15-Jan-2001  Transition of Care [facility]) CM/SW Contact:    Jimmy Picket, [place] Phone Number: 05/10/2020, 2:13 PM   Clinical Narrative: Pt was sleeping and unable to wake for assessment.    CAGE-AID Screening: Substance Abuse Screening unable to be completed due to: : Patient unable to participate                  Isabella Stalling Clinical Social Worker [phone]

## 2020-05-10 NOTE — Discharge Instructions (Signed)
Or[facility]opaedic Trauma Service Discharge Instructions   General Discharge Instructions  WEIGHT BEARING STATUS: Touchdown weightbearing left lower extremity  RANGE OF MOTION/ACTIVITY: Okay for hip motion as tolerated  Wound Care: Incisions can be left open to air if there is no drainage. If incision continues to have drainage, follow wound care instructions below. Okay to shower if no drainage from incisions.  DVT/PE prophylaxis: Aspirin 325 mg twice daily x 30 days  Diet: as you were eating previously.  Can use over the counter stool softeners and bowel preparations, such as Miralax, to help with bowel movements.  Narcotics can be constipating.  Be sure to drink plenty of fluids  PAIN MEDICATION USE AND EXPECTATIONS  You have likely been given narcotic medications to help control your pain.  After a traumatic event that results in an fracture (broken bone) with or without surgery, it is ok to use narcotic pain medications to help control one's pain.  We understand that everyone responds to pain differently and each individual patient will be evaluated on a regular basis for the continued need for narcotic medications. Ideally, narcotic medication use should last no more than 6-8 weeks (coinciding with fracture healing).   As a patient it is your responsibility as well to monitor narcotic medication use and report the amount and frequency you use these medications when you come to your office visit.   We would also advise that if you are using narcotic medications, you should take a dose prior to therapy to maximize you participation.  IF YOU ARE ON NARCOTIC MEDICATIONS IT IS NOT PERMISSIBLE TO OPERATE A MOTOR VEHICLE (MOTORCYCLE/CAR/TRUCK/MOPED) OR HEAVY MACHINERY DO NOT MIX NARCOTICS WITH OTHER CNS (CENTRAL NERVOUS SYSTEM) DEPRESSANTS SUCH AS ALCOHOL   STOP SMOKING OR USING NICOTINE PRODUCTS!!!!  As discussed nicotine severely impairs your body's ability to heal surgical and traumatic  wounds but also impairs bone healing.  Wounds and bone heal by forming microscopic blood vessels (angiogenesis) and nicotine is a vasoconstrictor (essentially, shrinks blood vessels).  Therefore, if vasoconstriction occurs to these microscopic blood vessels they essentially disappear and are unable to deliver necessary nutrients to the healing tissue.  This is one modifiable factor that you can do to dramatically increase your chances of healing your injury.    (This means no smoking, no nicotine gum, patches, etc)  DO NOT USE NONSTEROIDAL ANTI-INFLAMMATORY DRUGS (NSAID'S)  Using products such as Advil (ibuprofen), Aleve (naproxen), Motrin (ibuprofen) for additional pain control during fracture healing can delay and/or prevent the healing response.  If you would like to take over the counter (OTC) medication, Tylenol (acetaminophen) is ok.  However, some narcotic medications that are given for pain control contain acetaminophen as well. Therefore, you should not exceed more than 4000 mg of tylenol in a day if you do not have liver disease.  Also note that there are may OTC medicines, such as cold medicines and allergy medicines that my contain tylenol as well.  If you have any questions about medications and/or interactions please ask your doctor/PA or your pharmacist.      ICE AND ELEVATE INJURED/OPERATIVE EXTREMITY  Using ice and elevating the injured extremity above your heart can help with swelling and pain control.  Icing in a pulsatile fashion, such as 20 minutes on and 20 minutes off, can be followed.    Do not place ice directly on skin. Make sure there is a barrier between to skin and the ice pack.    Using frozen items such  as frozen peas works well as the conform nicely to the are that needs to be iced.  USE AN ACE WRAP OR TED HOSE FOR SWELLING CONTROL  In addition to icing and elevation, Ace wraps or TED hose are used to help limit and resolve swelling.  It is recommended to use Ace wraps or  TED hose until you are informed to stop.    When using Ace Wraps start the wrapping distally (farthest away from the body) and wrap proximally (closer to the body)   Example: If you had surgery on your leg or thing and you do not have a splint on, start the ace wrap at the toes and work your way up to the thigh        If you had surgery on your upper extremity and do not have a splint on, start the ace wrap at your fingers and work your way up to the upper arm  CALL THE OFFICE WITH ANY QUESTIONS OR CONCERNS: [phone]   VISIT OUR WEBSITE FOR ADDITIONAL INFORMATION: [email or web address]      Discharge Wound Care Instructions  Do NOT apply any ointments, solutions or lotions to pin sites or surgical wounds.  These prevent needed drainage and even though solutions like hydrogen peroxide kill bacteria, they also damage cells lining the pin sites that help fight infection.  Applying lotions or ointments can keep the wounds moist and can cause them to breakdown and open up as well. This can increase the risk for infection. When in doubt call the office.  Surgical incisions should be dressed daily.  If any drainage is noted, use one layer of adaptic, then gauze, Kerlix, and an ace wrap.  Once the incision is completely dry and without drainage, it may be left open to air out.  Showering may begin 36-48 hours later.  Cleaning gently with soap and water.  Traumatic wounds should be dressed daily as well.    One layer of adaptic, gauze, Kerlix, then ace wrap.  The adaptic can be discontinued once the draining has ceased    If you have a wet to dry dressing: wet the gauze with saline the squeeze as much saline out so the gauze is moist (not soaking wet), place moistened gauze over wound, then place a dry gauze over the moist one, followed by Kerlix wrap, then ace wrap.
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
## 2020-05-10 NOTE — Plan of Care (Signed)
  Problem: Pain Managment: Goal: General experience of comfort will improve Outcome: Progressing   Problem: Safety: Goal: Ability to remain free from injury will improve Outcome: Progressing   
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
## 2020-05-11 LAB — NASOPHARYNGEAL CULTURE: Culture: NORMAL

## 2020-05-11 MED ORDER — POLYETHYLENE GLYCOL 3350 17 G PO PACK
17.0000 g | PACK | Freq: Every day | ORAL | Status: DC
  Administered 2020-05-11: 17 g via ORAL
  Filled 2020-05-11 (×2): qty 1

## 2020-05-11 MED ORDER — DOCUSATE SODIUM 100 MG PO CAPS
100.0000 mg | ORAL_CAPSULE | Freq: Two times a day (BID) | ORAL | Status: DC
  Administered 2020-05-11 – 2020-05-12 (×3): 100 mg via ORAL
  Filled 2020-05-11 (×3): qty 1

## 2020-05-11 MED ORDER — FLEET ENEMA 7-19 GM/118ML RE ENEM: 1.0000 | ENEMA | Freq: Every day | RECTAL | Status: DC | PRN
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
## 2020-05-11 NOTE — Progress Notes (Signed)
Subjective: 2 Days Post-Op Procedure(s) (LRB): SACRO-ILIAC PINNING (Left) Patient reports pain as mild and moderate.   Unable to have a bowel movement since preadmission.  Objective: Vital signs in last 24 hours: Temp:  [97.7 F (36.5 C)-99.1 F (37.3 C)] 97.7 F (36.5 C) (09/04 0750) Pulse Rate:  [66-87] 81 (09/04 0750) Resp:  [16-17] 17 (09/04 0750) BP: (105-117)/(45-54) 117/54 (09/04 0750) SpO2:  [95 %-99 %] 99 % (09/04 0750)  Intake/Output from previous day: 09/03 0701 - 09/04 0700 In: 240 [P.O.:240] Out: -  Intake/Output this shift: Total I/O In: 240 [P.O.:240] Out: -   Recent Labs    05/09/20 0654 05/10/20 0415  HGB 11.4* 10.3*   Recent Labs    05/09/20 0654 05/10/20 0415  WBC 10.3 10.6*  RBC 4.15 3.77*  HCT 34.7* 32.0*  PLT 326 315   No results for input(s): NA, K, CL, CO2, BUN, CREATININE, GLUCOSE, CALCIUM in the last 72 hours. No results for input(s): LABPT, INR in the last 72 hours.  Neurovascular intact Sensation intact distally Intact pulses distally Dorsiflexion/Plantar flexion intact Incision: dressing C/D/I and dressing changed No cellulitis present   Assessment/Plan: 2 Days Post-Op Procedure(s) (LRB): SACRO-ILIAC PINNING (Left) Up with therapy Plan for discharge tomorrow Bowel regimen Cont pain control as ordered but try to limit narcotic lovenox dvt proph Wheelchair for d/c home     Margart Sickles 05/11/2020, 1:46 PM
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
## 2020-05-11 NOTE — Plan of Care (Signed)
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
## 2020-05-12 NOTE — Progress Notes (Signed)
Subjective: 3 Days Post-Op Procedure(s) (LRB): SACRO-ILIAC PINNING (Left) Patient sore. Ready to go home.  Objective: Vital signs in last 24 hours: Temp:  [97.6 F (36.4 C)-98.5 F (36.9 C)] 98.5 F (36.9 C) (09/05 0726) Pulse Rate:  [69-79] 69 (09/05 0726) Resp:  [16-17] 16 (09/05 0726) BP: (94-114)/(38-58) 108/53 (09/05 0726) SpO2:  [97 %-100 %] 97 % (09/05 0726)  Intake/Output from previous day: 09/04 0701 - 09/05 0700 In: 1020 [P.O.:1020] Out: -  Intake/Output this shift: No intake/output data recorded.  Recent Labs    05/10/20 0415  HGB 10.3*   Recent Labs    05/10/20 0415  WBC 10.6*  RBC 3.77*  HCT 32.0*  PLT 315   No results for input(s): NA, K, CL, CO2, BUN, CREATININE, GLUCOSE, CALCIUM in the last 72 hours. No results for input(s): LABPT, INR in the last 72 hours.  Neurovascular intact Sensation intact distally Intact pulses distally Dorsiflexion/Plantar flexion intact Incision: dressing C/D/I and dressing changed No cellulitis present   Assessment/Plan: 3 Days Post-Op Procedure(s) (LRB): SACRO-ILIAC PINNING (Left) Discharge today. Follow up in 2 weeks for x-rays   Tami Bauer 05/12/2020, 9:57 AM

## 2020-05-12 NOTE — Progress Notes (Addendum)
Discharge summary provided to pt and dad. With instructions. Pt and Dad verbalized understanding of instructions. Wound care management and educated on how to give lovenox shot as ordered.No complaints voiced...DME 3 in 1, rolling walker and wheelchair provided as ordered. No complaints. D/C as ordered.

## 2020-05-14 ENCOUNTER — Encounter: Payer: Self-pay | Admitting: Family Medicine

## 2020-05-20 ENCOUNTER — Encounter: Payer: Self-pay | Admitting: Family Medicine

## 2020-05-20 ENCOUNTER — Ambulatory Visit: Payer: 59 | Admitting: Family Medicine

## 2020-05-20 ENCOUNTER — Other Ambulatory Visit: Payer: Self-pay

## 2020-05-20 VITALS — BP 125/74 | HR 90 | Temp 97.7°F | Ht 64.0 in | Wt 205.0 lb

## 2020-05-20 DIAGNOSIS — M533 Sacrococcygeal disorders, not elsewhere classified: Secondary | ICD-10-CM | POA: Diagnosis not present
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
## 2020-05-20 NOTE — Patient Instructions (Signed)
pcp    If you have lab work done today you will be contacted with your lab results within the next 2 weeks.  If you have not heard from us then please contact us. The fastest way to get your results is to register for My Chart.   IF you received an x-ray today, you will receive an invoice from [facility]. Please contact [facility] at [phone] with questions or concerns regarding your invoice.   IF you received labwork today, you will receive an invoice from [facility]. Please contact [facility] at [phone] with questions or concerns regarding your invoice.   Our billing staff will not be able to assist you with questions regarding bills from these companies.  You will be contacted with the lab results as soon as they are available. The fastest way to get your results is to activate your My Chart account. Instructions are located on the last page of this paperwork. If you have not heard from us regarding the results in 2 weeks, please contact this office.     

## 2020-05-29 ENCOUNTER — Telehealth: Payer: Self-pay | Admitting: Family Medicine

## 2020-05-29 NOTE — Telephone Encounter (Signed)
05/29/2020 - REBECCA SCHWARTZ IS THE [employer] RN CASE MANAGER FOR THIS PATIENT. SHE WOULD LIKE OUR PROVIDERS TO KNOW THAT IF Jalie Pla NEEDS ANY ASSISTANCE OR COORDINATION OF SERVICES TO PLEASE GIVE HER A CALL. BEST PHONE IF EVER NEEDED IS: [phone] - REBECCA SCHWARTZ ([employer] RN CASE MANAGER) MBC

## 2020-05-29 NOTE — Telephone Encounter (Signed)
FYI

## 2020-05-30 ENCOUNTER — Telehealth: Payer: Self-pay | Admitting: Family Medicine

## 2020-05-30 NOTE — Telephone Encounter (Signed)
FYI

## 2020-05-30 NOTE — Telephone Encounter (Signed)
[facility] called and stated that they wanted provider to know that pt is going to be discharged. Pt is still on toe touch weight barring. Pts therapy with be out patient by the orthopedic. Please advise.

## 2020-05-31 NOTE — Telephone Encounter (Signed)
HH and PT orders managed by ortho

## 2020-08-22 ENCOUNTER — Telehealth: Payer: Self-pay | Admitting: Family Medicine

## 2020-08-22 NOTE — Telephone Encounter (Signed)
Received call from Martina Sinner, RN and registered care manager with [facility]. Caller stated she wants to introduce herself to the patient and assist with care. She requested that we confirm the patient's [employer] ID # beginning with a "w". Stated she has id # as [id number].   She already has our fax number and will fax something over to offer her assistance with patient care if needed. Please advise at [phone]. Number on caller id was [phone].

## 2020-08-23 NOTE — Telephone Encounter (Signed)
I have attempted to call Tami Bauer from [facility] to gather more information on what she is requesting. No answer so I left a message to call back.   I have also called the pt and informed her that Dr. Leretha Pol is no longer here so can we schedule a TOC appt for pt if she wants to continue care with [place]. I have transferred her to front desk to get that scheduled for her.

## 2020-10-29 ENCOUNTER — Ambulatory Visit (INDEPENDENT_AMBULATORY_CARE_PROVIDER_SITE_OTHER): Payer: 59 | Admitting: Family Medicine

## 2020-10-29 ENCOUNTER — Encounter: Payer: Self-pay | Admitting: Family Medicine

## 2020-10-29 ENCOUNTER — Other Ambulatory Visit: Payer: Self-pay

## 2020-10-29 VITALS — BP 124/54 | HR 93 | Temp 98.2°F | Ht 64.0 in | Wt 220.0 lb

## 2020-10-29 DIAGNOSIS — K219 Gastro-esophageal reflux disease without esophagitis: Secondary | ICD-10-CM

## 2020-10-29 DIAGNOSIS — Z1329 Encounter for screening for other suspected endocrine disorder: Secondary | ICD-10-CM

## 2020-10-29 DIAGNOSIS — Z130 Encounter for screening for diseases of the blood and blood-forming organs and certain disorders involving the immune mechanism: Secondary | ICD-10-CM

## 2020-10-29 DIAGNOSIS — S40921A Unspecified superficial injury of right upper arm, initial encounter: Secondary | ICD-10-CM

## 2020-10-29 DIAGNOSIS — W5503XA Scratched by cat, initial encounter: Secondary | ICD-10-CM

## 2020-10-29 DIAGNOSIS — Z13228 Encounter for screening for other metabolic disorders: Secondary | ICD-10-CM

## 2020-10-29 NOTE — Patient Instructions (Addendum)
Cat-Scratch Disease, Adult Cat-scratch disease is a bacterial infection that is caused by a bite or scratch from an infected cat. It is sometimes called cat-scratch fever. The infection can cause a red bump, swollen glands, and flu-like symptoms. The infection does not spread (is not contagious) between humans. In most cases, the infection is mild and it clears up without treatment. However, a more severe infection may develop in people who have other illnesses or problems that weaken the body's disease-fighting system (immune system). What are the causes? This condition is caused by bacteria called Bartonella henselae. These bacteria may be present in the mouth or on the claws of cats or kittens, especially those that are younger than 29 year old. Cats do not look or act sick when they have this infection. The bacteria can spread to humans through:  A bite or scratch.  Having contact with an infected cat and then touching your eyes or mouth. What increases the risk? You are more likely to develop this condition if:  You own or interact with a cat.  You have a weakened immune system. Your immune system may be weakened if: ? You are pregnant. ? You have certain conditions like cancer, HIV, or AIDS. ? You received a donated organ (transplant). What are the signs or symptoms? The first symptoms may be:  A bite or scratch that does not heal.  A red bump near the wound. The bump may be red, warm, and tender to the touch. Other symptoms may take a few weeks to develop. They may include:  Swollen, tender glands (lymph nodes) in the neck or under the arms.  Fever.  Rash.  Joint pain.  Headache.  Low energy.  Poor appetite.   How is this diagnosed? This condition is diagnosed based on your symptoms and your history of a scratch or bite from a cat. Your health care provider will examine your skin and look for swollen lymph nodes. You may have tests, such as:  A culture test. This  involves testing a sample of fluid or pus from your wound.  Blood tests.  A lymph node biopsy. This involves removing and testing a tissue sample from a swollen lymph node. How is this treated? Cat-scratch disease usually goes away without treatment in 2-4 months. Your health care provider may recommend home care, such as using a warm cloth (compress) and over-the-counter pain medicine. You may be prescribed antibiotic medicine if:  Your immune system is weakened.  Your symptoms cause discomfort. If you have a painful, swollen lymph node, it may need to be drained with a needle. (This is rare.) Follow these instructions at home:  Rest and return to your normal activities as told by your health care provider.  Take over-the-counter and prescription medicines only as told by your health care provider.  If you were prescribed an antibiotic medicine, take it as told by your health care provider. Do not stop taking the antibiotic even if you start to feel better.  Apply warm compresses to the area as told your health care provider.  Check your wound or swollen gland areas every day for signs of infection, such as: ? Redness, swelling, or pain. ? Fluid or blood. ? Warmth. ? Pus or a bad smell.  Keep all follow-up visits as told by your health care provider. This is important. How is this prevented?  Avoid being scratched or bitten while playing with cats. To do this, avoid playing roughly or taking food away from  a cat.  Wash your hands well after you have contact with a cat.  If you get scratched or bitten, wash the injured area as soon as possible with warm water and soap.  Do not let a cat lick your skin if you have any cuts, sores, or scratches.  Do not pick up or play with stray cats.  If you have an indoor cat, do not let your cat outside. Having contact with stray cats may make your cat more likely to have contact with the bacteria. Contact a health care provider if:  You  have redness, swelling, or pain around your wound.  You have fluid, blood, or pus coming from your wound.  Your wound is warm to the touch.  There is pus or a bad smell coming from your wound.  You have a fever.  Your symptoms get worse or do not get better at home. Get help right away if:  You have more swelling of your lymph nodes in your neck or under your arms.  You develop pain in your abdomen.  You develop a skin rash.  You feel dizzy or you faint.  You develop inflammation of your eye or vision problems.  You have pain in your bones.  You develop a stiff neck. Summary  Cat-scratch disease, sometimes called cat-scratch fever, is a bacterial infection that is caused by a bite or scratch from an infected cat.  The infection can cause a red bump, swollen glands, and flu-like symptoms.  Bacteria that cause this condition may be present in the mouth or on the claws of cats or kittens, especially those that are younger than 22 year old.  If you were prescribed an antibiotic medicine, take it as told by your health care provider. Do not stop taking the antibiotic even if you start to feel better. This information is not intended to replace advice given to you by your health care provider. Make sure you discuss any questions you have with your health care provider. Document Revised: 04/26/2020 Document Reviewed: 04/26/2020 Elsevier Patient Education  2021 [employer].    If you have lab work done today you will be contacted with your lab results within the next 2 weeks.  If you have not heard from [place] then please contact us. The fastest way to get your results is to register for My Chart.   IF you received an x-ray today, you will receive an invoice from [facility]. Please contact [facility] at [phone] with questions or concerns regarding your invoice.   IF you received labwork today, you will receive an invoice from [facility]. Please contact [facility]  at [phone] with questions or concerns regarding your invoice.   Our billing staff will not be able to assist you with questions regarding bills from these companies.  You will be contacted with the lab results as soon as they are available. The fastest way to get your results is to activate your My Chart account. Instructions are located on the last page of this paperwork. If you have not heard from [place] regarding the results in 2 weeks, please contact this office.

## 2020-10-29 NOTE — Progress Notes (Signed)
2/22/202212:22 PM  Tami Bauer 11-04-2000, 20 y.o., female [id number]  Chief Complaint  Patient presents with  . cat scrach     Got scratch by someone else cat 10/17/20 Started to have rash on hand after forgetting to take her antibiotic for a day     HPI:   Patient is a 20 y.o. female with past medical history significant for bipolar who presents today for possible infection  Cat scratch on right upper arm 2/10 Went to Urgent Care 2/11 Given Clindamycin 300 mg tid for 10 days Doxycyline 100 mg bid x 10 days Stopped antibiotics 2/15 Rash on hands 2/16 2/17 Woke up with myalgia, cold sweat, shivering 2/18 headaches: rash spread to face and neck (errythema noted) 2/19 enlarged neck  2/20 generalized muscle soreness Restarted antibiotics on 2/17 Still has 2 days left Has been taking a probiotic Did end up finding owner of cat Cat is up to date on vaccinations     Depression screen [facility] 2/9 10/29/2020 04/12/2020 01/30/2020  Decreased Interest 0 0 0  Down, Depressed, Hopeless 0 0 0  PHQ - 2 Score 0 0 0    Fall Risk  10/29/2020 05/20/2020 04/12/2020 01/30/2020  Falls in the past year? 0 0 0 0  Number falls in past yr: 0 0 0 0  Injury with Fall? 0 0 0 0  Follow up Falls evaluation completed - Falls evaluation completed Falls evaluation completed     Allergies  Allergen Reactions  . Penicillins Hives and Rash  . Escitalopram Oxalate Other (See Comments)    Hallucinations   . Fluoxetine Other (See Comments)    Depression   . Hepatitis B Vaccine Other (See Comments)    Trouble walking  . Adhesive [Tape] Rash  . Sertraline Anxiety and Other (See Comments)    Depression, also  . Sulfamethoxazole-Trimethoprim Rash    Prior to Admission medications   Medication Sig Start Date End Date Taking? Authorizing Provider  acetaminophen (TYLENOL) 500 MG tablet Take 500-1,000 mg by mouth every 6 (six) hours as needed for mild pain or headache.   Yes [provider]   cariprazine (VRAYLAR) capsule Take 1.5 mg by mouth See admin instructions. Take 1.5 mg by mouth every other morning   Yes [provider]  famotidine (PEPCID) 40 MG tablet Take 1 tablet (40 mg total) by mouth at bedtime. 05/10/20  Yes Delray Alt, PA-C  fluvoxaMINE (LUVOX) 100 MG tablet Take 150 mg by mouth at bedtime.    Yes [provider]  JUNEL 1/20 1-20 MG-MCG tablet Take 1 tablet by mouth at bedtime.  01/24/20  Yes [provider]  omeprazole (PRILOSEC) 40 MG capsule Take 1 capsule (40 mg total) by mouth daily. Patient taking differently: Take 40 mg by mouth daily before breakfast. 03/04/20  Yes Jacelyn Pi, Lilia Argue, MD  ondansetron (ZOFRAN) 8 MG tablet Take 1 tablet (8 mg total) by mouth every 8 (eight) hours as needed for nausea or vomiting. 04/12/20  Yes Jacelyn Pi, Lilia Argue, MD  polyethylene glycol powder [facility]) 17 GM/SCOOP powder Take 17 g by mouth 2 (two) times daily as needed. Patient taking differently: Take 17 g by mouth 2 (two) times daily as needed for mild constipation ([place]). 01/30/20  Yes Jacelyn Pi, Lilia Argue, MD  VYVANSE 50 MG capsule Take 50 mg by mouth in the morning. 04/04/20  Yes [provider]  enoxaparin (LOVENOX) 40 MG/0.4ML injection Inject 0.4 mLs (40 mg total) into the  skin daily. 05/11/20 06/10/20  Delray Alt, PA-C    Past Medical History:  Diagnosis Date  . Allergy   . Anxiety    Phreesia 01/30/2020  . Bipolar 2 disorder ([place])   . GERD (gastroesophageal reflux disease)    Phreesia 01/30/2020    Past Surgical History:  Procedure Laterality Date  . ADENOIDECTOMY    . SACRO-ILIAC PINNING Left 05/09/2020   Procedure: SACRO-ILIAC PINNING;  Surgeon: Shona Needles, MD;  Location: [place] [facility];  Service: Orthopedics;  Laterality: Left;    Social History   Tobacco Use  . Smoking status: Never Smoker  . Smokeless tobacco: Never Used  Substance Use Topics  . Alcohol use: Yes    Alcohol/week: 1.0 standard  drink    Types: 1 Shots of liquor per week    Comment: weekly    Family History  Problem Relation Age of Onset  . Mental illness Mother   . Rheum arthritis Maternal Uncle   . Diabetes Maternal Grandmother   . Leukemia Maternal Grandfather   . Breast cancer Paternal Grandmother   . Dementia Paternal Grandmother   . Breast cancer Paternal Grandfather   . Dementia Paternal Grandfather     Review of Systems  Constitutional: Negative for chills, fever and malaise/fatigue.  Eyes: Negative for blurred vision and double vision.  Respiratory: Negative for cough, shortness of breath and wheezing.   Cardiovascular: Negative for chest pain, palpitations and leg swelling.  Gastrointestinal: Negative for abdominal pain, blood in stool, constipation, diarrhea, heartburn, nausea and vomiting.  Genitourinary: Negative for dysuria, frequency and hematuria.  Musculoskeletal: Negative for back pain and joint pain.  Skin: Negative for rash.  Neurological: Negative for dizziness, weakness and headaches.     OBJECTIVE:  Today's Vitals   10/29/20 1118  BP: (!) 124/54  Pulse: 93  Temp: 98.2 F (36.8 C)  TempSrc: Temporal  SpO2: 97%  Weight: 220 lb (99.8 kg)  Height: 5' 4"  (1.626 m)   Body mass index is 37.76 kg/m.   Physical Exam Constitutional:      General: She is not in acute distress.    Appearance: Normal appearance. She is not ill-appearing.  HENT:     Head: Normocephalic.  Cardiovascular:     Rate and Rhythm: Normal rate and regular rhythm.     Pulses: Normal pulses.     Heart sounds: Normal heart sounds. No murmur heard. No friction rub. No gallop.   Pulmonary:     Effort: Pulmonary effort is normal. No respiratory distress.     Breath sounds: Normal breath sounds. No stridor. No wheezing, rhonchi or rales.  Abdominal:     General: Bowel sounds are normal.     Palpations: Abdomen is soft.     Tenderness: There is no abdominal tenderness.  Musculoskeletal:     Right  lower leg: No edema.     Left lower leg: No edema.  Skin:    General: Skin is warm and dry.  Neurological:     Mental Status: She is alert and oriented to person, place, and time.  Psychiatric:        Mood and Affect: Mood normal.        Behavior: Behavior normal.     No results found for this or any previous visit (from the past 24 hour(s)).  No results found.   ASSESSMENT and PLAN  Problem List Items Addressed This Visit      Digestive   Gastroesophageal reflux disease  Other Visit Diagnoses    Cat scratch    -  Primary   Relevant Orders   CBC with Differential   Screening for endocrine, metabolic and immunity disorder       Relevant Orders   CMP14+EGFR   TSH   Hemoglobin A1c   Vitamin D, 25-hydroxy      Plan . Will follow up with labs . Continue antibiotics, follow up if no improvement post antibiotics . RTC/ED precautions provided   Return if symptoms worsen or fail to improve.   Huston Foley Rodney Wigger, FNP-BC [facility] at [place], [place] [address] Ph.  [phone] Fax [phone]

## 2020-10-30 LAB — CBC WITH DIFFERENTIAL/PLATELET
Basophils Absolute: 0.1 10*3/uL (ref 0.0–0.2)
Basos: 1 %
EOS (ABSOLUTE): 0.5 10*3/uL — ABNORMAL HIGH (ref 0.0–0.4)
Eos: 9 %
Hematocrit: 38.1 % (ref 34.0–46.6)
Hemoglobin: 12 g/dL (ref 11.1–15.9)
Immature Grans (Abs): 0.1 10*3/uL (ref 0.0–0.1)
Immature Granulocytes: 1 %
Lymphocytes Absolute: 2.5 10*3/uL (ref 0.7–3.1)
Lymphs: 44 %
MCH: 25.6 pg — ABNORMAL LOW (ref 26.6–33.0)
MCHC: 31.5 g/dL (ref 31.5–35.7)
MCV: 81 fL (ref 79–97)
Monocytes Absolute: 0.4 10*3/uL (ref 0.1–0.9)
Monocytes: 7 %
Neutrophils Absolute: 2.2 10*3/uL (ref 1.4–7.0)
Neutrophils: 38 %
Platelets: 292 10*3/uL (ref 150–450)
RBC: 4.68 x10E6/uL (ref 3.77–5.28)
RDW: 14.9 % (ref 11.7–15.4)
WBC: 5.7 10*3/uL (ref 3.4–10.8)

## 2020-10-30 LAB — CMP14+EGFR
ALT: 22 IU/L (ref 0–32)
AST: 20 IU/L (ref 0–40)
Albumin/Globulin Ratio: 1.4 (ref 1.2–2.2)
Albumin: 4.1 g/dL (ref 3.9–5.0)
Alkaline Phosphatase: 123 IU/L — ABNORMAL HIGH (ref 44–121)
BUN/Creatinine Ratio: 17 (ref 9–23)
BUN: 12 mg/dL (ref 6–20)
Bilirubin Total: <0.2 mg/dL (ref 0.0–1.2)
CO2: 20 mmol/L (ref 20–29)
Calcium: 9.1 mg/dL (ref 8.7–10.2)
Chloride: 103 mmol/L (ref 96–106)
Creatinine, Ser: 0.7 mg/dL (ref 0.57–1.00)
GFR calc Af Amer: 143 mL/min/{1.73_m2} (ref >59)
GFR calc non Af Amer: 124 mL/min/{1.73_m2} (ref >59)
Globulin, Total: 2.9 g/dL (ref 1.5–4.5)
Glucose: 84 mg/dL (ref 65–99)
Potassium: 4.5 mmol/L (ref 3.5–5.2)
Sodium: 139 mmol/L (ref 134–144)
Total Protein: 7 g/dL (ref 6.0–8.5)

## 2020-10-30 LAB — HEMOGLOBIN A1C
Est. average glucose Bld gHb Est-mCnc: 114 mg/dL
Hgb A1c MFr Bld: 5.6 % (ref 4.8–5.6)

## 2020-10-30 LAB — TSH: TSH: 1.86 u[IU]/mL (ref 0.450–4.500)

## 2020-10-30 LAB — VITAMIN D 25 HYDROXY (VIT D DEFICIENCY, FRACTURES): Vit D, 25-Hydroxy: 39.1 ng/mL (ref 30.0–100.0)

## 2020-12-02 ENCOUNTER — Encounter: Payer: 59 | Admitting: Family Medicine

## 2021-03-06 ENCOUNTER — Encounter: Payer: 59 | Admitting: Family Medicine

## 2021-07-01 ENCOUNTER — Other Ambulatory Visit: Payer: Self-pay | Admitting: Gastroenterology

## 2021-07-01 ENCOUNTER — Other Ambulatory Visit (HOSPITAL_COMMUNITY): Payer: Self-pay | Admitting: Gastroenterology

## 2021-07-01 DIAGNOSIS — R112 Nausea with vomiting, unspecified: Secondary | ICD-10-CM

## 2021-10-13 ENCOUNTER — Other Ambulatory Visit (HOSPITAL_COMMUNITY): Payer: Self-pay | Admitting: Gastroenterology

## 2021-10-13 ENCOUNTER — Other Ambulatory Visit: Payer: Self-pay | Admitting: Gastroenterology

## 2021-10-13 DIAGNOSIS — R112 Nausea with vomiting, unspecified: Secondary | ICD-10-CM

## 2021-10-20 ENCOUNTER — Encounter (HOSPITAL_COMMUNITY)
Admission: RE | Admit: 2021-10-20 | Discharge: 2021-10-20 | Disposition: A | Discharge status: Home / Self Care | Payer: 59 | Source: Ambulatory Visit | Attending: Gastroenterology | Admitting: Gastroenterology

## 2021-10-20 DIAGNOSIS — R112 Nausea with vomiting, unspecified: Secondary | ICD-10-CM | POA: Diagnosis present

## 2021-10-20 MED ORDER — TECHNETIUM TC 99M MEBROFENIN IV KIT
5.2000 | PACK | Freq: Once | INTRAVENOUS | Status: AC | PRN
  Administered 2021-10-20: 5.2 via INTRAVENOUS
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
## 2021-12-27 IMAGING — RF DG C-ARM 1-60 MIN
1 series · 15 of 24 positions shown · non-contrast
Comparison: Preoperative CT yesterday.

CLINICAL DATA: Sacroiliac pinning. Trauma

EXAM:
DG C-ARM 1-60 MIN; JUDET PELVIS - 3+ VIEW
FLUOROSCOPY TIME:  Fluoroscopy Time:  3 minutes 55 seconds
Radiation Exposure Index (if provided by the fluoroscopic device):
71.1 mGy
Number of Acquired Spot Images: 27

[Series 1: run · 15 of 27 slices shown]
[im 1/27]
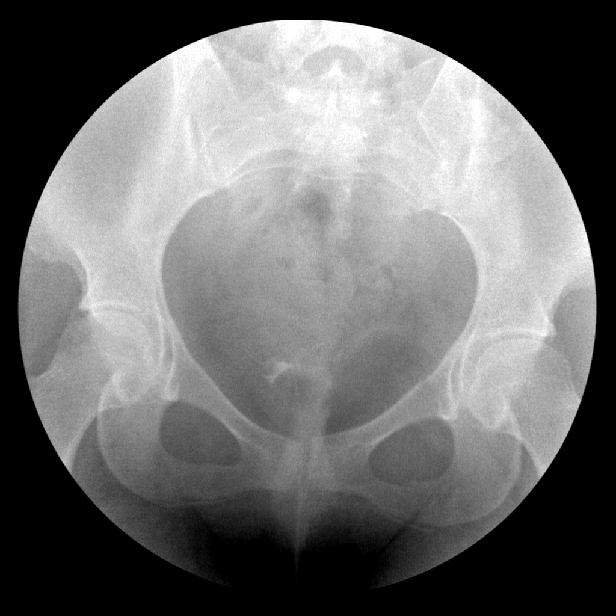
[im 3/27]
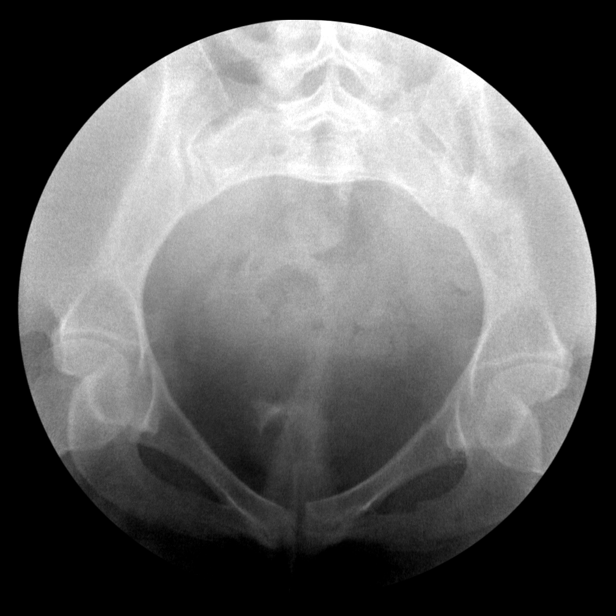
[im 5/27]
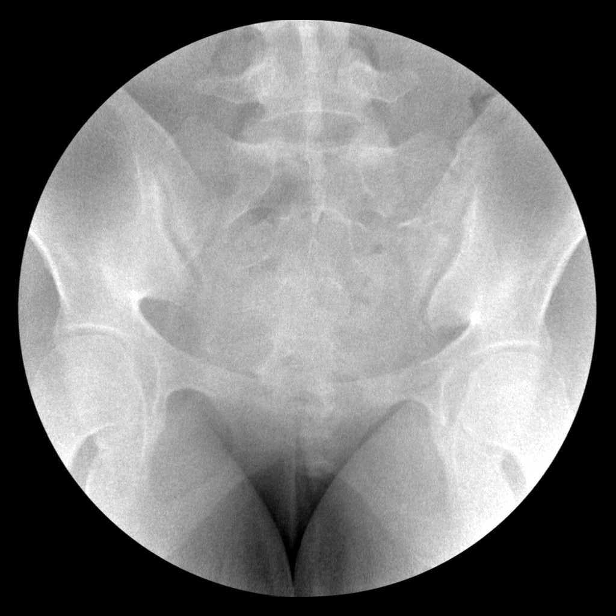
[im 6/27]
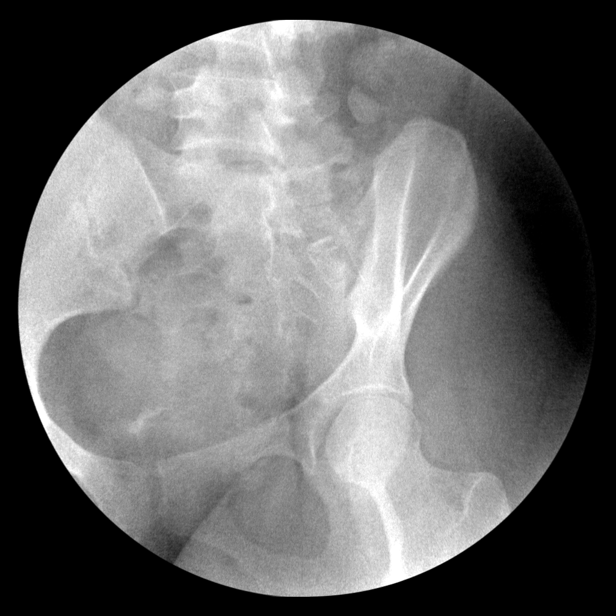
[im 8/27]
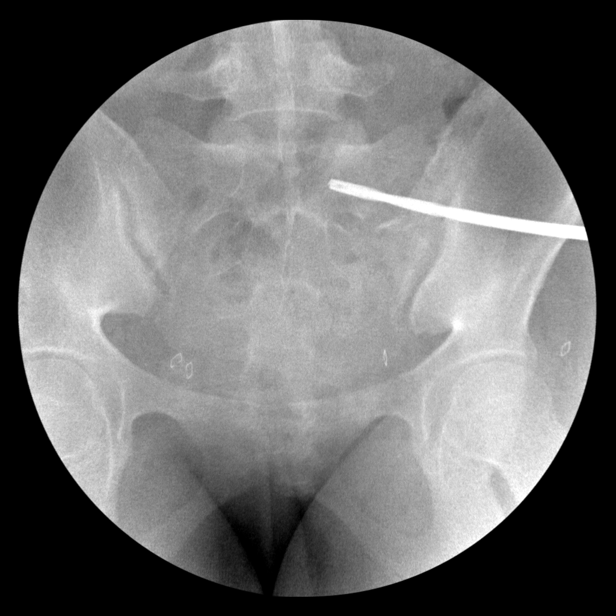
[im 10/27]
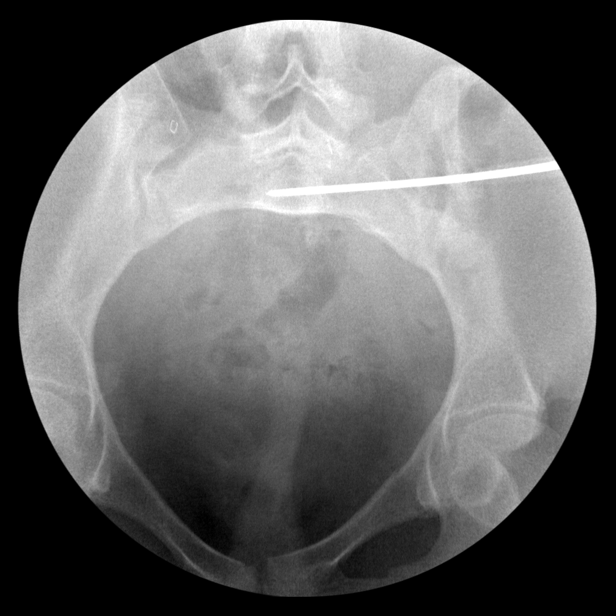
[im 12/27]
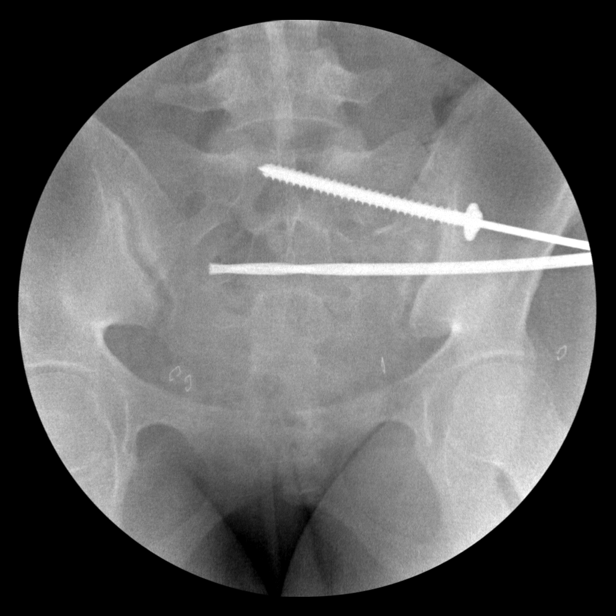
[im 14/27]
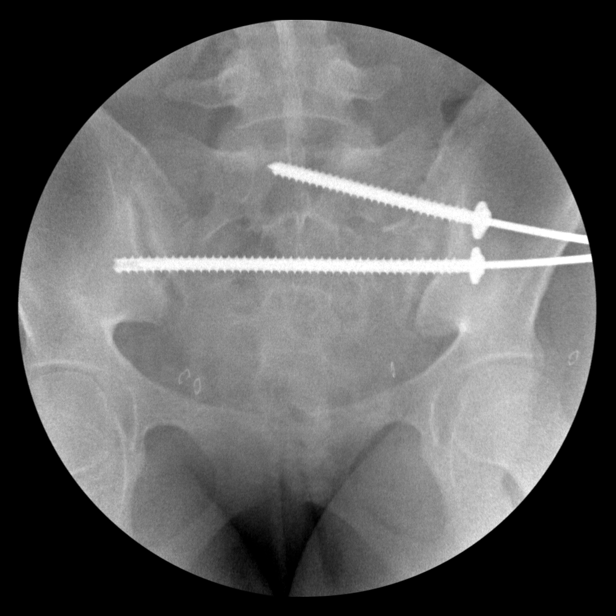
[im 15/27]
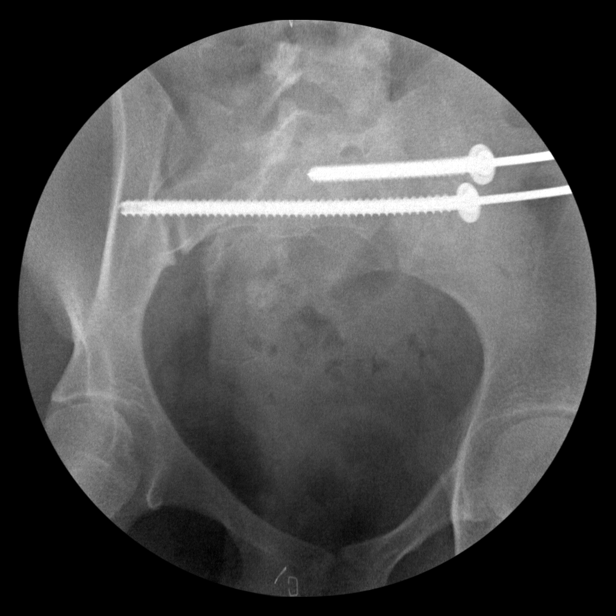
[im 17/27]
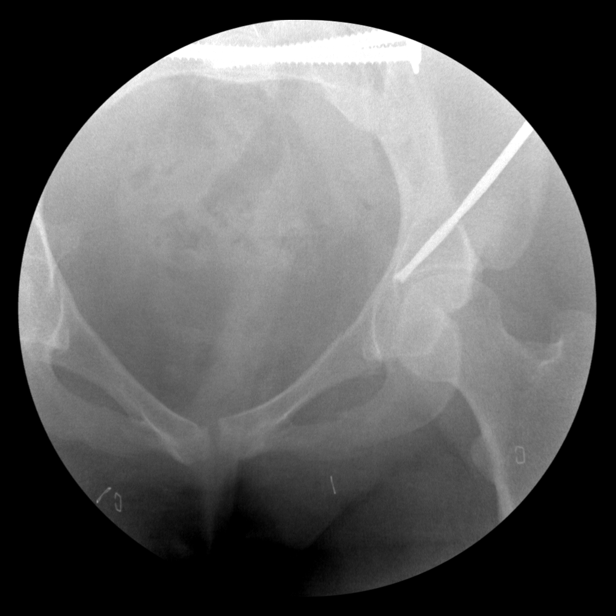
[im 19/27]
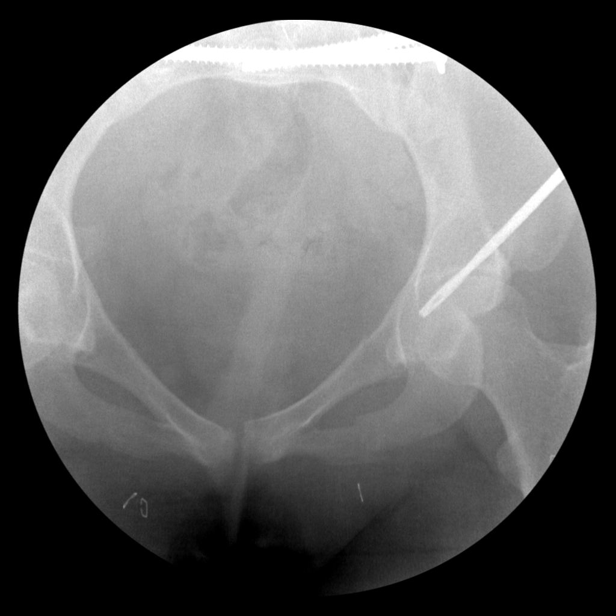
[im 21/27]
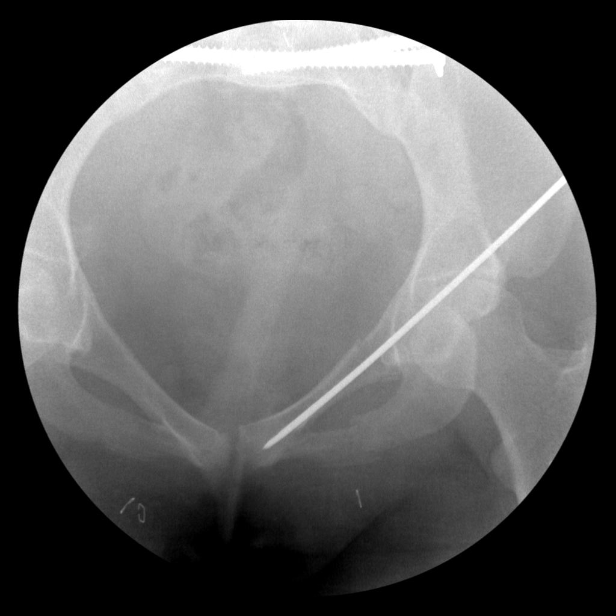
[im 23/27]
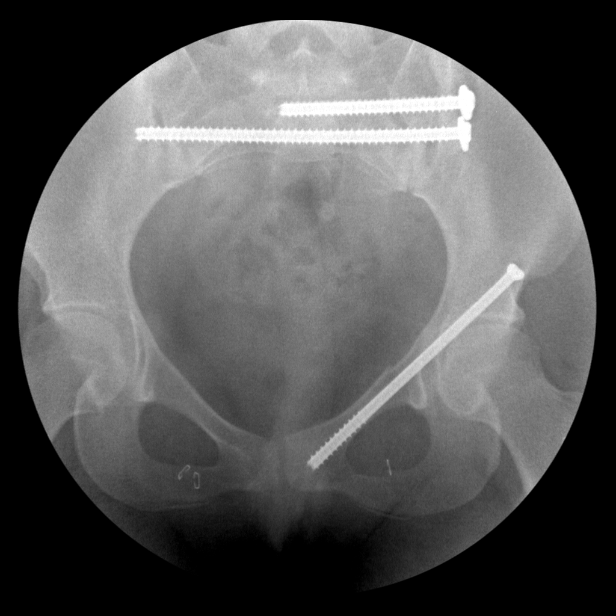
[im 24/27]
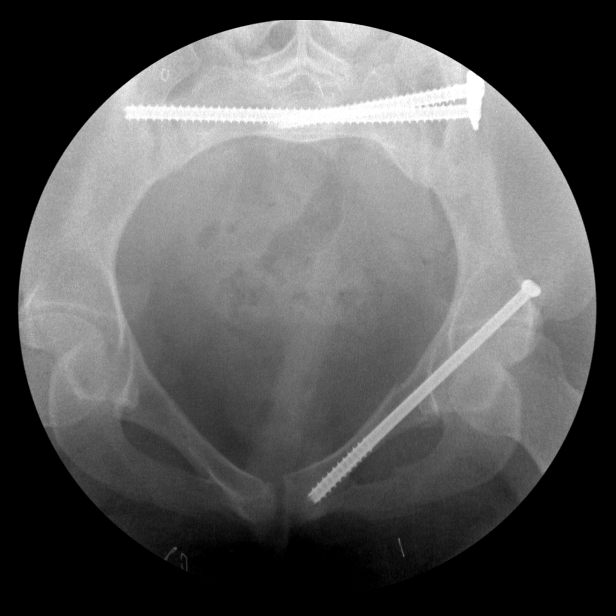
[im 27/27]
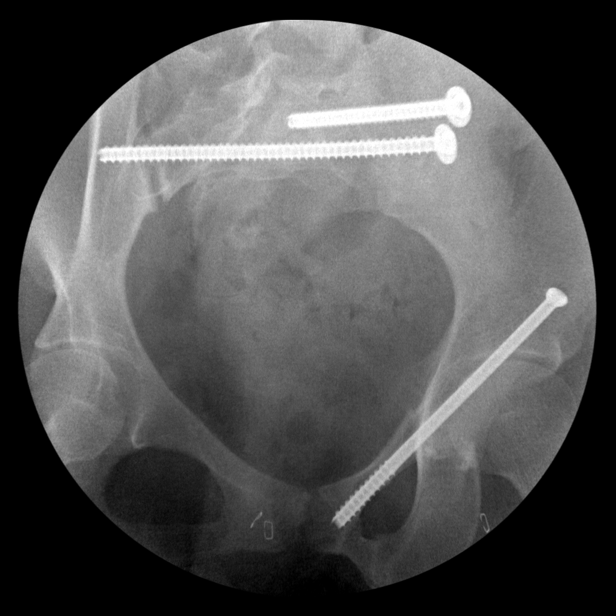

[15 of 24 positions shown; findings below may reference images not displayed]

FINDINGS: Twenty-seven fluoroscopic spot views obtained in the operating room.
Screw traverses both sacroiliac joints. An additional screw
traverses just the left sacroiliac joint. Screw traverses the left
acetabulum and superior pubic ramus. Fluoroscopy time 3 minutes 55
seconds.
IMPRESSION: Intraoperative fluoroscopy during sacroiliac joint and left pelvic
fracture fixation.

## 2022-01-15 ENCOUNTER — Other Ambulatory Visit (HOSPITAL_COMMUNITY): Payer: Self-pay | Admitting: Gastroenterology

## 2022-01-15 ENCOUNTER — Other Ambulatory Visit: Payer: Self-pay | Admitting: Gastroenterology

## 2022-01-15 DIAGNOSIS — R112 Nausea with vomiting, unspecified: Secondary | ICD-10-CM

## 2022-02-03 ENCOUNTER — Ambulatory Visit (HOSPITAL_COMMUNITY)
Admission: RE | Admit: 2022-02-03 | Discharge: 2022-02-03 | Disposition: A | Discharge status: Home / Self Care | Payer: 59 | Source: Ambulatory Visit | Attending: Gastroenterology | Admitting: Gastroenterology

## 2022-02-03 ENCOUNTER — Encounter (HOSPITAL_COMMUNITY): Payer: Self-pay

## 2022-02-03 DIAGNOSIS — R112 Nausea with vomiting, unspecified: Secondary | ICD-10-CM | POA: Insufficient documentation

## 2022-02-03 MED ORDER — TECHNETIUM TC 99M MEBROFENIN IV KIT
5.0000 | PACK | Freq: Once | INTRAVENOUS | Status: AC | PRN
  Administered 2022-02-03: 5 via INTRAVENOUS

## 2022-02-12 ENCOUNTER — Ambulatory Visit (HOSPITAL_COMMUNITY): Payer: 59

## 2022-02-24 ENCOUNTER — Ambulatory Visit (HOSPITAL_COMMUNITY)
Admission: RE | Admit: 2022-02-24 | Discharge: 2022-02-24 | Disposition: A | Discharge status: Home / Self Care | Payer: 59 | Source: Ambulatory Visit | Attending: Gastroenterology | Admitting: Gastroenterology

## 2022-02-24 DIAGNOSIS — R112 Nausea with vomiting, unspecified: Secondary | ICD-10-CM | POA: Diagnosis present

## 2022-02-24 MED ORDER — TECHNETIUM TC 99M SULFUR COLLOID
2.0000 | Freq: Once | INTRAVENOUS | Status: AC | PRN
  Administered 2022-02-24: 2 via INTRAVENOUS

## 2023-02-20 ENCOUNTER — Emergency Department (HOSPITAL_BASED_OUTPATIENT_CLINIC_OR_DEPARTMENT_OTHER)
Admission: EM | Admit: 2023-02-20 | Discharge: 2023-02-20 | Disposition: A | Discharge status: Home / Self Care | Payer: 59 | Attending: Emergency Medicine | Admitting: Emergency Medicine

## 2023-02-20 ENCOUNTER — Encounter (HOSPITAL_BASED_OUTPATIENT_CLINIC_OR_DEPARTMENT_OTHER): Payer: Self-pay | Admitting: Emergency Medicine

## 2023-02-20 ENCOUNTER — Other Ambulatory Visit: Payer: Self-pay

## 2023-02-20 DIAGNOSIS — J029 Acute pharyngitis, unspecified: Secondary | ICD-10-CM | POA: Insufficient documentation

## 2023-02-20 DIAGNOSIS — J020 Streptococcal pharyngitis: Secondary | ICD-10-CM

## 2023-02-20 MED ORDER — DEXAMETHASONE SODIUM PHOSPHATE 10 MG/ML IJ SOLN
10.0000 mg | Freq: Once | INTRAMUSCULAR | Status: AC
  Administered 2023-02-20: 10 mg via INTRAMUSCULAR
  Filled 2023-02-20: qty 1

## 2023-02-20 NOTE — ED Provider Notes (Addendum)
[place] EMERGENCY DEPARTMENT AT [facility] Provider Note   CSN: [id number] Arrival date & time: 02/20/23  1027     History  Chief Complaint  Patient presents with   Sore Throat    Tami Bauer is a 22 y.o. female.  22 year old patient presents for recheck of sore throat.  Patient diagnosed with strep recently and is currently finishing their last day of antibiotics.  This is the patient's second bout of pharyngitis.  Denies any fever.  Notes symptoms have improved.  Did receive 1 dose of steroids earlier in the course of the infection.  Concern due to increasing exudate on the tonsils.  No new fevers above 101 degrees.  Patient able to swallow properly       Home Medications Prior to Admission medications   Medication Sig Start Date End Date Taking? Authorizing Provider  acetaminophen (TYLENOL) 500 MG tablet Take 500-1,000 mg by mouth every 6 (six) hours as needed for mild pain or headache.    [provider]  cariprazine (VRAYLAR) capsule Take 1.5 mg by mouth See admin instructions. Take 1.5 mg by mouth every other morning    [provider]  enoxaparin (LOVENOX) 40 MG/0.4ML injection Inject 0.4 mLs (40 mg total) into the skin daily. 05/11/20 06/10/20  West Bali, PA-C  famotidine (PEPCID) 40 MG tablet Take 1 tablet (40 mg total) by mouth at bedtime. 05/10/20   [place] Bali, PA-C  fluvoxaMINE (LUVOX) 100 MG tablet Take 150 mg by mouth at bedtime.     [provider]  JUNEL 1/20 1-20 MG-MCG tablet Take 1 tablet by mouth at bedtime.  01/24/20   [provider]  omeprazole (PRILOSEC) 40 MG capsule Take 1 capsule (40 mg total) by mouth daily. Patient taking differently: Take 40 mg by mouth daily before breakfast. 03/04/20   Lezlie Lye, Meda Coffee, MD  ondansetron (ZOFRAN) 8 MG tablet Take 1 tablet (8 mg total) by mouth every 8 (eight) hours as needed for nausea or vomiting. 04/12/20   Lezlie Lye, Meda Coffee, MD  polyethylene glycol  powder [facility]) 17 GM/SCOOP powder Take 17 g by mouth 2 (two) times daily as needed. Patient taking differently: Take 17 g by mouth 2 (two) times daily as needed for mild constipation (MIX AND DRINK). 01/30/20   Noni Saupe, MD  VYVANSE 50 MG capsule Take 50 mg by mouth in the morning. 04/04/20   [provider]      Allergies    Penicillins, Escitalopram oxalate, Fluoxetine, Hepatitis b vaccine, Adhesive [tape], Sertraline, and Sulfamethoxazole-trimethoprim    Review of Systems   Review of Systems  All other systems reviewed and are negative.   Physical Exam Updated Vital Signs BP 120/75 (BP Location: Right Arm)   Pulse 92   Temp 98.8 F (37.1 C) (Oral)   Resp 20   SpO2 98%  Physical Exam Vitals and nursing note reviewed.  Constitutional:      General: She is not in acute distress.    Appearance: Normal appearance. She is well-developed. She is not toxic-appearing.  HENT:     Head: Normocephalic and atraumatic.     Mouth/Throat:     Pharynx: Oropharyngeal exudate, posterior oropharyngeal erythema and uvula swelling present.     Tonsils: Tonsillar exudate present. No tonsillar abscesses.  Eyes:     General: Lids are normal.     Conjunctiva/sclera: Conjunctivae normal.     Pupils: Pupils are equal, round, and reactive to light.  Neck:  Thyroid: No thyroid mass.     Trachea: No tracheal deviation.  Cardiovascular:     Rate and Rhythm: Normal rate and regular rhythm.     Heart sounds: Normal heart sounds. No murmur heard.    No gallop.  Pulmonary:     Effort: Pulmonary effort is normal. No respiratory distress.     Breath sounds: Normal breath sounds. No stridor. No decreased breath sounds, wheezing, rhonchi or rales.  Abdominal:     General: There is no distension.     Palpations: Abdomen is soft.     Tenderness: There is no abdominal tenderness. There is no rebound.  Musculoskeletal:        General: No tenderness. Normal range of motion.      Cervical back: Normal range of motion and neck supple.  Skin:    General: Skin is warm and dry.     Findings: No abrasion or rash.  Neurological:     Mental Status: She is alert and oriented to person, place, and time. Mental status is at baseline.     GCS: GCS eye subscore is 4. GCS verbal subscore is 5. GCS motor subscore is 6.     Cranial Nerves: No cranial nerve deficit.     Sensory: No sensory deficit.     Motor: Motor function is intact.  Psychiatric:        Attention and Perception: Attention normal.        Speech: Speech normal.        Behavior: Behavior normal.     ED Results / Procedures / Treatments   Labs (all labs ordered are listed, but only abnormal results are displayed) Labs Reviewed - No data to display  EKG None  Radiology No results found.  Procedures Procedures    Medications Ordered in ED Medications - No data to display  ED Course/ Medical Decision Making/ A&P                             Medical Decision Making Risk Prescription drug management.   Patient with resolving pharyngitis at this time.  Will give additional dose of Decadron here.  Did discuss with patient about doing a CT scan to evaluate for peritonsillar abscess.  Clinically, patient does not have any hot potato voice, stridor, uvula deviation.  Shared decision making used and they will defer having the CT scan at this time.  Suspect the patient has a superimposed viral infection as well.  Complete antibiotics today and will give ENT referral.  Instructed to follow-up if symptoms become worse.        Final Clinical Impression(s) / ED Diagnoses Final diagnoses:  None    Rx / DC Orders ED Discharge Orders     None         Lorre Nick, MD 02/20/23 1610    Lorre Nick, MD 02/20/23 [phone]

## 2023-02-20 NOTE — ED Triage Notes (Signed)
Tuesday had sore throat, Wednesday fever,chills, urgent care on Thursday positive for strep. Pt received steroid injection and 3 days antibiotics. Urgent care instructed pt to monitor size of tonsils ,to monitor for possible abscess. Pt states tonsils are still enlarged,and hard time swallowing. Fevers have subsided .

## 2023-02-20 NOTE — Discharge Instructions (Addendum)
Go to [facility] if your symptoms become worse

## 2024-06-30 NOTE — Progress Notes (Signed)
 Subjective:    Subjective  Tami Bauer is a 23 y.o. female who presents for Ear Pain (RT ear pain/injury)  History of Present Illness This is a 23 year old female with a history of frequent ear and sinus infections during childhood, presenting with right ear pain.  The patient reports that she used hydrogen peroxide to alleviate itching in her right ear but forgot to rinse it out for several hours. This resulted in significant pain, which was somewhat relieved after rinsing. Since then, she has been experiencing intermittent dull pain in the ear canal and drainage in her throat. She is concerned about potential damage to her ear. The patient reports no fever, sore throat, cough, drainage from the ear, or bleeding from the ear. She is uncertain about any changes in her hearing. She has been managing the pain with over-the-counter analgesics such as Tylenol  and ibuprofen. The patient only wants to see if there is an ear infection that she inadvertently caused.  History reviewed. No pertinent past medical history.  Allergies[1]   History reviewed. No pertinent surgical history.  History reviewed. No pertinent family history.  Social History[2]   Tobacco Use: High Risk (01/24/2024)   Received from [facility]   Patient History   . Smoking Tobacco Use: Some Days   . Smokeless Tobacco Use: Never   . Passive Exposure: Not on file    Problem List[3]  Medications Ordered Prior to Encounter[4]   Past medical, surgical and family history reviewed.   Review of Systems  Constitutional:  Negative for chills, fever and malaise/fatigue.  HENT:  Positive for ear pain. Negative for congestion, sinus pain and sore throat.   Eyes:  Negative for double vision, pain, discharge and redness.  Respiratory:  Negative for cough, sputum production, shortness of breath and wheezing.   Cardiovascular:  Negative for chest pain.  Gastrointestinal:  Negative for abdominal pain, diarrhea,  nausea and vomiting.  Musculoskeletal:  Negative for back pain and myalgias.  Skin:  Negative for rash.  Neurological:  Negative for dizziness, weakness and headaches.    Objective:   Vitals:   06/30/24 1011  BP: 121/82  Pulse: 83  Resp: 19  Temp: 98.6 F (37 C)  SpO2: 97%  Weight: 187 lb 3.2 oz (84.9 kg)  Height: 5' 5 (1.651 m)    Physical Exam Vitals and nursing note reviewed.  Constitutional:      General: She is not in acute distress.    Appearance: Normal appearance. She is not ill-appearing, toxic-appearing or diaphoretic.  HENT:     Head: Normocephalic and atraumatic.     Right Ear: Tympanic membrane, ear canal and external ear normal. There is no impacted cerumen.     Left Ear: Tympanic membrane, ear canal and external ear normal. There is no impacted cerumen.     Nose: Nose normal. No congestion or rhinorrhea.     Mouth/Throat:     Mouth: Mucous membranes are moist.     Pharynx: Oropharynx is clear. No oropharyngeal exudate or posterior oropharyngeal erythema.  Eyes:     General:        Right eye: No discharge.        Left eye: No discharge.     Extraocular Movements: Extraocular movements intact.     Conjunctiva/sclera: Conjunctivae normal.  Cardiovascular:     Rate and Rhythm: Normal rate and regular rhythm.     Pulses: Normal pulses.     Heart sounds: Normal heart sounds.  Pulmonary:  Effort: Pulmonary effort is normal.     Breath sounds: Normal breath sounds.  Musculoskeletal:     Cervical back: Normal range of motion.  Lymphadenopathy:     Cervical: No cervical adenopathy.  Skin:    General: Skin is warm and dry.     Findings: No lesion or rash.  Neurological:     General: No focal deficit present.     Mental Status: She is alert and oriented to person, place, and time.  Psychiatric:        Mood and Affect: Mood normal.        Behavior: Behavior normal.      Assessment:   Laboratory Testing Results of tests available at time of  visit: No results found for this or any previous visit (from the past 24 hours).   Radiology No results found.   Plan:   1. Right ear pain          Patient's Medications    as of June 30, 2024 11:02 AM   You have not been prescribed any medications.      MEDICAL DECISION MAKING   No signs of infection.  Reports right ear pain improving after home irrigation and use of Tylenol  and Ibuprofen.  Advised of red flags and when to go to the ED or see further evaluation.  Assessment & Plan Initial Assessment: 23 year old female with right ear pain after using hydrogen peroxide for ear itching and not rinsing it out promptly. No fever, sore throat, cough, ear drainage, or bleeding. Examination revealed no infection.  Urgent Care Course: - Examination of right ear - No infection found - Advised to continue taking Tylenol  or ibuprofen for pain management - Depression screening completed  Final Assessment: Right ear pain after using hydrogen peroxide. No infection found. Advised to continue taking Tylenol  or ibuprofen for pain management.  Clinical Impression: - Right ear pain  Disposition: - Discharge: Patient discharged  Patient Education: Advised to continue taking Tylenol  or ibuprofen for pain management.    Provider Concerns:  Discussed importance of close monitoring and early return if symptoms worsen.    Complexity No additional elements  Comorbidities No comorbidities noted.    Problems Self-limited problem (2)  Data No significant data or note reviewed (2)  Risk Low (3)  OTC Meds (3)   Previsit planning was completed via snapshot and review of chart.   I reviewed the patient instructions on the AVS with the patient/family verbalized to me that they understood what their problem is, what they need to do about it, and why it is important that they do it.    The patient/family indicates understanding of these issues and agrees with the plan The  patient/family voices understanding of all medications. No barriers to adherence were noted. Patient is taking all medications as prescribed and is tolerating well.  Plan for follow-up as discussed or as needed if any worsening symptoms or change in condition.  After Visit Summary was offered and/or provided via MyChart.  Follow up instructions  Patient Instructions  You may take Tylenol  or Ibuprofen as directed for any pain or discomfort. If you develop severe pain, drainage, or a loss of hearing, go to the ED for immediate evaluation. Contact clinic with any questions or concerns.   The patient indicates understanding of these issues and agrees with the plan. Lissa GORMAN Counter, PA-C    Publishing Rights Manager was used to create visit note. Consent from the patient/caregiver was obtained prior  to its use.            [1] Not on File [2] Social History Socioeconomic History  . Marital status: Unknown  [3] There is no problem list on file for this patient.  [4] No current outpatient medications on file prior to visit.   No current facility-administered medications on file prior to visit.

## 2024-08-04 ENCOUNTER — Other Ambulatory Visit: Payer: Self-pay

## 2024-08-04 ENCOUNTER — Emergency Department (HOSPITAL_BASED_OUTPATIENT_CLINIC_OR_DEPARTMENT_OTHER)

## 2024-08-04 ENCOUNTER — Emergency Department (HOSPITAL_BASED_OUTPATIENT_CLINIC_OR_DEPARTMENT_OTHER)
Admission: EM | Admit: 2024-08-04 | Discharge: 2024-08-04 | Disposition: A | Discharge status: Home / Self Care | Attending: Emergency Medicine | Admitting: Emergency Medicine

## 2024-08-04 ENCOUNTER — Encounter (HOSPITAL_BASED_OUTPATIENT_CLINIC_OR_DEPARTMENT_OTHER): Payer: Self-pay | Admitting: Emergency Medicine

## 2024-08-04 DIAGNOSIS — N83202 Unspecified ovarian cyst, left side: Secondary | ICD-10-CM | POA: Diagnosis not present

## 2024-08-04 DIAGNOSIS — R1031 Right lower quadrant pain: Secondary | ICD-10-CM | POA: Diagnosis present

## 2024-08-04 LAB — URINALYSIS, ROUTINE W REFLEX MICROSCOPIC
Bilirubin Urine: NEGATIVE
Glucose, UA: NEGATIVE mg/dL
Hgb urine dipstick: NEGATIVE
Ketones, ur: NEGATIVE mg/dL
Leukocytes,Ua: NEGATIVE
Nitrite: NEGATIVE
Protein, ur: NEGATIVE mg/dL
Specific Gravity, Urine: 1.031 — ABNORMAL HIGH (ref 1.005–1.030)
pH: 5.5 (ref 5.0–8.0)

## 2024-08-04 LAB — COMPREHENSIVE METABOLIC PANEL WITH GFR
ALT: 12 U/L (ref 0–44)
AST: 21 U/L (ref 15–41)
Albumin: 4.2 g/dL (ref 3.5–5.0)
Alkaline Phosphatase: 92 U/L (ref 38–126)
Anion gap: 10 (ref 5–15)
BUN: 16 mg/dL (ref 6–20)
CO2: 27 mmol/L (ref 22–32)
Calcium: 9.7 mg/dL (ref 8.9–10.3)
Chloride: 103 mmol/L (ref 98–111)
Creatinine, Ser: 0.71 mg/dL (ref 0.44–1.00)
GFR, Estimated: >60 mL/min (ref >60)
Glucose, Bld: 82 mg/dL (ref 70–99)
Potassium: 4.1 mmol/L (ref 3.5–5.1)
Sodium: 140 mmol/L (ref 135–145)
Total Bilirubin: <0.2 mg/dL (ref 0.0–1.2)
Total Protein: 7.3 g/dL (ref 6.5–8.1)

## 2024-08-04 LAB — PREGNANCY, URINE: Preg Test, Ur: NEGATIVE

## 2024-08-04 LAB — CBC
HCT: 36.2 % (ref 36.0–46.0)
Hemoglobin: 11.8 g/dL — ABNORMAL LOW (ref 12.0–15.0)
MCH: 26.9 pg (ref 26.0–34.0)
MCHC: 32.6 g/dL (ref 30.0–36.0)
MCV: 82.6 fL (ref 80.0–100.0)
Platelets: 364 K/uL (ref 150–400)
RBC: 4.38 MIL/uL (ref 3.87–5.11)
RDW: 15 % (ref 11.5–15.5)
WBC: 8.7 K/uL (ref 4.0–10.5)
nRBC: 0 % (ref 0.0–0.2)

## 2024-08-04 LAB — LIPASE, BLOOD: Lipase: 31 U/L (ref 11–51)

## 2024-08-04 NOTE — ED Triage Notes (Signed)
 Pt via pov from home with abdominal cramping and lower back pain x 5 days, worsening today. Denies blood in urine. Pain is in RLQ and R Flank. Pt also reports being lightheaded periodically. Pt a&o x 4; nad noted.

## 2024-08-04 NOTE — Discharge Instructions (Addendum)
 It was a pleasure taking care of you today. You were seen in the Emergency Department for evaluation of right-sided abdominal pain. Your work-up was reassuring. Your lab work was unremarkable.  There was no presence of infection.  Your pregnancy test was negative.  Your pelvic ultrasound shows no evidence of ovarian torsion.  However there was an incidental finding that showed a large complex left-sided ovarian cyst.  Recommendation is for a nonemergent pelvic MRI for further characterization and to further exclude the presence of an underlying neoplastic process.  This can be scheduled outpatient by either a a PCP or OB/GYN.  I have given you a referral to both a new family medicine provider as well as an OB/GYN.  You will need to call either or to schedule an appointment for further evaluation and to schedule the MRI.  I have also provided you with patient instructions on more information surrounding abdominal pain as well as ovarian cysts.    Please return to the ER if you experience chest pain, trouble breathing, intractable nausea/vomiting or any other life threatening illnesses.

## 2024-08-04 NOTE — Progress Notes (Signed)
 Patient came in stating her IUD was displaced and inquiring if we could correct that in the urgent care.  She has no urinary complaints and states she knows it is her IUD.  She has a follow-up appointment on Monday.  She request for refund due to the inability of the clinic being able to correct her IUD placement.

## 2024-08-04 NOTE — ED Provider Notes (Signed)
 [place] EMERGENCY DEPARTMENT AT [facility] Provider Note   CSN: [id number] Arrival date & time: 08/04/24  1753     Patient presents with: Abdominal Pain   Tami Bauer is a 23 y.o. female with past medical history of GERD, bipolar type II, anxiety, gender dysphoria who presents emergency department for evaluation of right lower quadrant pain.  Patient reports that over the last 5 days, they have had worsening abdominal pain.  The pain is described as similar to menstrual cramps, however the pain is also present in the inner thigh/groin area as well as near the glutes.  The patient denies any dysuria or hematuria.  Patient reports they had an IUD placed approximately 1 year ago to alleviate menses.  However, up until this month they continue to have regular menstrual cycles every 30 days.  This patient's last menstrual cycle was approximately 41 days ago.  Patient did have 1 episode of emesis earlier today, although they state that is not unusual as they have a sensitive stomach.  No reported nausea, fever, chills, body aches.  Patient did take 800 mg of ibuprofen PTA.    Abdominal Pain Associated symptoms: vomiting        Prior to Admission medications   Medication Sig Start Date End Date Taking? Authorizing Provider  acetaminophen  (TYLENOL ) 500 MG tablet Take 500-1,000 mg by mouth every 6 (six) hours as needed for mild pain or headache.    [provider]  cariprazine  (VRAYLAR ) capsule Take 1.5 mg by mouth See admin instructions. Take 1.5 mg by mouth every other morning    [provider]  enoxaparin  (LOVENOX ) 40 MG/0.4ML injection Inject 0.4 mLs (40 mg total) into the skin daily. 05/11/20 06/10/20  Danton Lauraine LABOR, PA-C  famotidine  (PEPCID ) 40 MG tablet Take 1 tablet (40 mg total) by mouth at bedtime. 05/10/20   Danton Lauraine LABOR, PA-C  fluvoxaMINE  (LUVOX ) 100 MG tablet Take 150 mg by mouth at bedtime.     [provider]  JUNEL  1/20 1-20 MG-MCG  tablet Take 1 tablet by mouth at bedtime.  01/24/20   [provider]  omeprazole  (PRILOSEC) 40 MG capsule Take 1 capsule (40 mg total) by mouth daily. Patient taking differently: Take 40 mg by mouth daily before breakfast. 03/04/20   Melonie Colonel, Mikel HERO, MD  ondansetron  (ZOFRAN ) 8 MG tablet Take 1 tablet (8 mg total) by mouth every 8 (eight) hours as needed for nausea or vomiting. 04/12/20   Melonie Colonel, Mikel HERO, MD  polyethylene glycol powder (GLYCOLAX /MIRALAX ) 17 GM/SCOOP powder Take 17 g by mouth 2 (two) times daily as needed. Patient taking differently: Take 17 g by mouth 2 (two) times daily as needed for mild constipation (MIX AND DRINK). 01/30/20   Melonie Colonel Mikel HERO, MD  VYVANSE 50 MG capsule Take 50 mg by mouth in the morning. 04/04/20   [provider]    Allergies: Penicillins, Escitalopram oxalate, Fluoxetine, Hepatitis b vaccine, Adhesive [tape], Sertraline, and Sulfamethoxazole-trimethoprim    Review of Systems  Gastrointestinal:  Positive for abdominal pain and vomiting.    Updated Vital Signs BP 123/71 (BP Location: Right Arm)   Pulse 63   Temp 97.9 F (36.6 C)   Resp 18   Ht 5' 5 (1.651 m)   Wt 84.8 kg   LMP 06/25/2024 (Exact Date)   SpO2 100%   BMI 31.12 kg/m   Physical Exam Vitals and nursing note reviewed.  Constitutional:      Appearance: Normal appearance.  HENT:     Head: Normocephalic and atraumatic.     Mouth/Throat:     Mouth: Mucous membranes are moist.  Eyes:     General: No scleral icterus.       Right eye: No discharge.        Left eye: No discharge.     Conjunctiva/sclera: Conjunctivae normal.  Cardiovascular:     Rate and Rhythm: Normal rate and regular rhythm.     Pulses: Normal pulses.  Pulmonary:     Effort: Pulmonary effort is normal.     Breath sounds: Normal breath sounds.  Abdominal:     General: There is no distension.     Tenderness: There is abdominal tenderness in the right lower quadrant.     Comments:  Significant RLQ tenderness to palpation.  LLQ nontender to palpation.  No CVA tenderness bilaterally.  Musculoskeletal:        General: No deformity.     Cervical back: Normal range of motion.  Skin:    General: Skin is warm and dry.     Capillary Refill: Capillary refill takes less than 2 seconds.  Neurological:     Mental Status: She is alert.     Motor: No weakness.  Psychiatric:        Mood and Affect: Mood normal.     (all labs ordered are listed, but only abnormal results are displayed) Labs Reviewed  CBC - Abnormal; Notable for the following components:      Result Value   Hemoglobin 11.8 (*)    All other components within normal limits  URINALYSIS, ROUTINE W REFLEX MICROSCOPIC - Abnormal; Notable for the following components:   Specific Gravity, Urine 1.031 (*)    All other components within normal limits  LIPASE, BLOOD  COMPREHENSIVE METABOLIC PANEL WITH GFR  PREGNANCY, URINE    EKG: None  Radiology: US  PELVIC COMPLETE W TRANSVAGINAL AND TORSION R/O Result Date: 08/04/2024 CLINICAL DATA:  Right lower quadrant/right flank cramping x5 days. EXAM: TRANSABDOMINAL AND TRANSVAGINAL ULTRASOUND OF PELVIS DOPPLER ULTRASOUND OF OVARIES TECHNIQUE: Both transabdominal and transvaginal ultrasound examinations of the pelvis were performed. Transabdominal technique was performed for global imaging of the pelvis including uterus, ovaries, adnexal regions, and pelvic cul-de-sac. It was necessary to proceed with endovaginal exam following the transabdominal exam to visualize the uterus, endometrium, bilateral ovaries and bilateral adnexa. Color and duplex Doppler ultrasound was utilized to evaluate blood flow to the ovaries. COMPARISON:  None Available. FINDINGS: Uterus Measurements: 8.3 cm x 3.5 cm x 3.8 cm = volume: 57 mL. No fibroids or other mass visualized. Endometrium Thickness: 1.9 mm. No focal abnormality visualized. A properly positioned IUD is in place. Right ovary Measurements:  2.9 cm x 1.6 cm x 1.6 cm = volume: 3.7 mL. Normal appearance/no adnexal mass. Left ovary Measurements: 3.6 cm x 2.4 cm x 3.7 cm = volume: 16.7 mL. A 4.4 cm x 4.2 cm x 4.7 cm septated cystic appearing structure is seen within the left ovary. No abnormal flow is noted within this area, while flow is seen along its periphery. Pulsed Doppler evaluation of both ovaries demonstrates normal low-resistance arterial and venous waveforms. Other findings No abnormal free fluid. IMPRESSION: 1. Large, complex left ovarian cyst. Correlation with nonemergent pelvic MRI is recommended for improved characterization and to further exclude the presence of an underlying neoplastic process. 2. Properly positioned IUD. 3. Normal bilateral ovarian flow. Electronically Signed   By: Suzen Dials M.D.   On: 08/04/2024 21:52  Procedures   Medications Ordered in the ED - No data to display                                 Medical Decision Making Amount and/or Complexity of Data Reviewed Labs: ordered. Radiology: ordered.   This patient presents to the ED for concern of abdominal pain and cramping, this involves an extensive number of treatment options, and is a complaint that carries with it a high risk of complications and morbidity.   Differential diagnosis includes: Ovarian torsion, pregnancy, normal menstrual pain  Ectopic pregnancy, pancreatitis, cholecystitis, appendicitis  Co morbidities:  none  Additional history:  Patient was seen at urgent care earlier this evening requesting her IUD to replaced.  Urgent care was unable to do this so patient report to the emergency department.  Lab Tests:  I Ordered, and personally interpreted labs.  The pertinent results include: No acute abnormalities to explain patient's symptoms  Imaging Studies:  I ordered imaging studies including transabdominal ultrasound I independently visualized and interpreted imaging which showed   Large, complex left ovarian  cyst. Correlation with nonemergent  pelvic MRI is recommended for improved characterization and to  further exclude the presence of an underlying neoplastic proces   I agree with the radiologist interpretation  Cardiac Monitoring/ECG:  The patient was maintained on a cardiac monitor.  I personally viewed and interpreted the cardiac monitored which showed an underlying rhythm of: Sinus rhythm  Medicines ordered and prescription drug management:  I offered pain medication, however patient declined  Test Considered:   none  Critical Interventions:   none  Consultations Obtained: None  Problem List / ED Course:     ICD-10-CM   1. RLQ cramping  R10.31     2. Left ovarian cyst  N83.202       MDM: 23 year old female who presents emergency department for evaluation of abdominal pain.  Patient has been experiencing RLQ abdominal pain that has been worsening in severity for the last 5 days.  Patient had an IUD placed approximately 1 year ago and previously had regular menstrual cycles every month.  However, patient's most recent menstrual cycle was 41 days ago.  On physical exam, patient is significantly tender in the RLQ.  Patient also has pain in the inner thigh/groin as well as the back.  Patient had 1 episode of emesis and associated lightheadedness earlier today.  Based on patient's workup, I believe these are interrelated and secondary to pain.  Initial lab work was unremarkable.  However, I have concern this could possibly be ovarian torsion so I ordered an ultrasound.  Ultrasound results show no evidence of ovarian torsion.  However there is note of a complex left ovarian cyst.  Recommendation for a nonemergent outpatient MRI is recommended to the patient.  I have informed the patient of this incidental finding.  I did offer the patient pain medicine, however they declined at this time.  Patient did take 800 mg of ibuprofen PTA.  Upon reevaluation, patient is feeling better and not  currently in pain.  I did inform the patient about their ultrasound results and answered questions to the best of my knowledge.  I also provided the patient with a referral to a new OB/GYN and family medicine provider.  I recommended patient schedule an appointment with a PCP or OB/GYN for further evaluation and workup of this abdominal cramping.  Patient verbalized understanding to this.  Vital signs are stable.  Patient is appropriate for discharge at this time.   Dispostion:  After consideration of the diagnostic results and the patients response to treatment, I feel that the patient would benefit from follow-up with OB/GYN or PCP.   Final diagnoses:  RLQ cramping  Left ovarian cyst    ED Discharge Orders     None          Torrence Marry GORMAN DEVONNA 08/04/24 2210    Freddi Hamilton, MD 08/05/24 [phone]

## 2024-08-04 NOTE — ED Notes (Signed)
 Patient transported to [place]

## 2024-08-07 ENCOUNTER — Emergency Department (HOSPITAL_BASED_OUTPATIENT_CLINIC_OR_DEPARTMENT_OTHER)

## 2024-08-07 ENCOUNTER — Ambulatory Visit: Payer: Self-pay

## 2024-08-07 ENCOUNTER — Emergency Department (HOSPITAL_BASED_OUTPATIENT_CLINIC_OR_DEPARTMENT_OTHER)
Admission: EM | Admit: 2024-08-07 | Discharge: 2024-08-07 | Disposition: A | Discharge status: Home / Self Care | Attending: Emergency Medicine | Admitting: Emergency Medicine

## 2024-08-07 ENCOUNTER — Encounter (HOSPITAL_BASED_OUTPATIENT_CLINIC_OR_DEPARTMENT_OTHER): Payer: Self-pay

## 2024-08-07 ENCOUNTER — Other Ambulatory Visit: Payer: Self-pay

## 2024-08-07 DIAGNOSIS — N83202 Unspecified ovarian cyst, left side: Secondary | ICD-10-CM

## 2024-08-07 DIAGNOSIS — R1032 Left lower quadrant pain: Secondary | ICD-10-CM

## 2024-08-07 LAB — COMPREHENSIVE METABOLIC PANEL WITH GFR
ALT: <5 U/L (ref 0–44)
AST: 15 U/L (ref 15–41)
Albumin: 4.1 g/dL (ref 3.5–5.0)
Alkaline Phosphatase: 80 U/L (ref 38–126)
Anion gap: 8 (ref 5–15)
BUN: 10 mg/dL (ref 6–20)
CO2: 28 mmol/L (ref 22–32)
Calcium: 9.4 mg/dL (ref 8.9–10.3)
Chloride: 105 mmol/L (ref 98–111)
Creatinine, Ser: 0.66 mg/dL (ref 0.44–1.00)
GFR, Estimated: >60 mL/min (ref >60)
Glucose, Bld: 90 mg/dL (ref 70–99)
Potassium: 3.9 mmol/L (ref 3.5–5.1)
Sodium: 141 mmol/L (ref 135–145)
Total Bilirubin: <0.2 mg/dL (ref 0.0–1.2)
Total Protein: 6.8 g/dL (ref 6.5–8.1)

## 2024-08-07 LAB — LIPASE, BLOOD: Lipase: 26 U/L (ref 11–51)

## 2024-08-07 LAB — CBC WITH DIFFERENTIAL/PLATELET
Abs Immature Granulocytes: 0.03 K/uL (ref 0.00–0.07)
Basophils Absolute: 0 K/uL (ref 0.0–0.1)
Basophils Relative: 0 %
Eosinophils Absolute: 0.1 K/uL (ref 0.0–0.5)
Eosinophils Relative: 2 %
HCT: 36.7 % (ref 36.0–46.0)
Hemoglobin: 11.8 g/dL — ABNORMAL LOW (ref 12.0–15.0)
Immature Granulocytes: 0 %
Lymphocytes Relative: 31 %
Lymphs Abs: 2.4 K/uL (ref 0.7–4.0)
MCH: 26.5 pg (ref 26.0–34.0)
MCHC: 32.2 g/dL (ref 30.0–36.0)
MCV: 82.5 fL (ref 80.0–100.0)
Monocytes Absolute: 0.4 K/uL (ref 0.1–1.0)
Monocytes Relative: 5 %
Neutro Abs: 4.8 K/uL (ref 1.7–7.7)
Neutrophils Relative %: 62 %
Platelets: 346 K/uL (ref 150–400)
RBC: 4.45 MIL/uL (ref 3.87–5.11)
RDW: 14.7 % (ref 11.5–15.5)
WBC: 7.8 K/uL (ref 4.0–10.5)
nRBC: 0 % (ref 0.0–0.2)

## 2024-08-07 LAB — HCG, SERUM, QUALITATIVE: Preg, Serum: NEGATIVE

## 2024-08-07 MED ORDER — ONDANSETRON HCL 4 MG/2ML IJ SOLN
4.0000 mg | Freq: Once | INTRAMUSCULAR | Status: AC
  Administered 2024-08-07: 4 mg via INTRAVENOUS
  Filled 2024-08-07: qty 2

## 2024-08-07 MED ORDER — NAPROXEN 500 MG PO TABS: 500.0000 mg | ORAL_TABLET | Freq: Two times a day (BID) | ORAL | 0 refills | Status: DC

## 2024-08-07 MED ORDER — KETOROLAC TROMETHAMINE 15 MG/ML IJ SOLN
15.0000 mg | Freq: Once | INTRAMUSCULAR | Status: AC
  Administered 2024-08-07: 15 mg via INTRAVENOUS
  Filled 2024-08-07: qty 1

## 2024-08-07 MED ORDER — HYDROMORPHONE HCL 1 MG/ML IJ SOLN
0.5000 mg | Freq: Once | INTRAMUSCULAR | Status: AC
  Administered 2024-08-07: 0.5 mg via INTRAVENOUS
  Filled 2024-08-07: qty 1

## 2024-08-07 MED ORDER — MORPHINE SULFATE (PF) 4 MG/ML IV SOLN
4.0000 mg | Freq: Once | INTRAVENOUS | Status: DC
  Filled 2024-08-07: qty 1

## 2024-08-07 NOTE — ED Provider Notes (Signed)
 [place] EMERGENCY DEPARTMENT AT [facility] Provider Note   CSN: [id number] Arrival date & time: 08/07/24  1017     Patient presents with: Abdominal Pain   Tami Bauer is a 23 y.o. female.  Patient is a 23 year old female who presents to the ED for increasing abdominal pain for the past week.  Patient notes that she was seen in the ED 4 days prior for similar symptoms.  She was told she had an ovarian cyst on the left side and was told to follow-up outpatient for an MRI.  She states she called PCP and gynecologist but was told they do not do walk-in MRIs.  States pain has gotten worse over the weekend prompting her to return to the ED.  Has been taking Tylenol , ibuprofen, and using heating pad with minimal relief.  States last menstrual cycle was 1 month prior, does have IUD in place.  Denies fevers, chest pain, shortness of breath, nausea/vomiting/diarrhea, urinary symptoms, vaginal bleeding/spotting.  No further complaints.    Abdominal Pain Associated symptoms: no fever, no nausea and no vomiting        Prior to Admission medications   Medication Sig Start Date End Date Taking? Authorizing Provider  naproxen  (NAPROSYN ) 500 MG tablet Take 1 tablet (500 mg total) by mouth 2 (two) times daily. 08/07/24  Yes Lurdes Haltiwanger, Thersia RAMAN, PA-C  acetaminophen  (TYLENOL ) 500 MG tablet Take 500-1,000 mg by mouth every 6 (six) hours as needed for mild pain or headache.    [provider]  cariprazine  (VRAYLAR ) capsule Take 1.5 mg by mouth See admin instructions. Take 1.5 mg by mouth every other morning    [provider]  enoxaparin  (LOVENOX ) 40 MG/0.4ML injection Inject 0.4 mLs (40 mg total) into the skin daily. 05/11/20 06/10/20  Danton Lauraine LABOR, PA-C  famotidine  (PEPCID ) 40 MG tablet Take 1 tablet (40 mg total) by mouth at bedtime. 05/10/20   Danton Lauraine LABOR, PA-C  fluvoxaMINE  (LUVOX ) 100 MG tablet Take 150 mg by mouth at bedtime.     [provider]  JUNEL  1/20 1-20  MG-MCG tablet Take 1 tablet by mouth at bedtime.  01/24/20   [provider]  omeprazole  (PRILOSEC) 40 MG capsule Take 1 capsule (40 mg total) by mouth daily. Patient taking differently: Take 40 mg by mouth daily before breakfast. 03/04/20   Melonie Colonel, Mikel HERO, MD  ondansetron  (ZOFRAN ) 8 MG tablet Take 1 tablet (8 mg total) by mouth every 8 (eight) hours as needed for nausea or vomiting. 04/12/20   Melonie Colonel, Mikel HERO, MD  polyethylene glycol powder (GLYCOLAX /MIRALAX ) 17 GM/SCOOP powder Take 17 g by mouth 2 (two) times daily as needed. Patient taking differently: Take 17 g by mouth 2 (two) times daily as needed for mild constipation (MIX AND DRINK). 01/30/20   Melonie Colonel Mikel HERO, MD  VYVANSE 50 MG capsule Take 50 mg by mouth in the morning. 04/04/20   [provider]    Allergies: Penicillins, Escitalopram oxalate, Fluoxetine, Hepatitis b vaccine, Adhesive [tape], Sertraline, and Sulfamethoxazole-trimethoprim    Review of Systems  Constitutional:  Negative for fever.  Gastrointestinal:  Positive for abdominal pain. Negative for nausea and vomiting.  All other systems reviewed and are negative.   Updated Vital Signs BP 105/61   Pulse (!) 53   Temp 98.2 F (36.8 C)   Resp 16   LMP 06/25/2024 (Exact Date)   SpO2 100%   Physical Exam Constitutional:      Appearance: Tami Bauer is  well-developed.  HENT:     Head: Normocephalic and atraumatic.  Cardiovascular:     Rate and Rhythm: Normal rate.  Pulmonary:     Effort: Pulmonary effort is normal.  Abdominal:     Palpations: Abdomen is soft.     Tenderness: There is generalized abdominal tenderness and tenderness in the left lower quadrant.  Skin:    General: Skin is warm and dry.  Neurological:     Mental Status: Tami Bauer is alert and oriented to person, place, and time.  Psychiatric:        Mood and Affect: Mood normal.        Behavior: Behavior normal.     (all labs ordered are listed, but only abnormal results  are displayed) Labs Reviewed  CBC WITH DIFFERENTIAL/PLATELET - Abnormal; Notable for the following components:      Result Value   Hemoglobin 11.8 (*)    All other components within normal limits  COMPREHENSIVE METABOLIC PANEL WITH GFR  LIPASE, BLOOD  HCG, SERUM, QUALITATIVE    EKG: None  Radiology: US  PELVIC TRANSABD W/PELVIC DOPPLER Result Date: 08/07/2024 CLINICAL DATA:  Left lower quadrant pain EXAM: TRANSABDOMINAL ULTRASOUND OF PELVIS DOPPLER ULTRASOUND OF OVARIES TECHNIQUE: Transabdominal ultrasound examination of the pelvis was performed including evaluation of the uterus, ovaries, adnexal regions, and pelvic cul-de-sac. Color and duplex Doppler ultrasound was utilized to evaluate blood flow to the ovaries. COMPARISON:  Pelvic ultrasound 08/04/2024 FINDINGS: Uterus Measurements: 7.0 x 3.2 x 4.8 cm = volume: 57 mL. No fibroids or other mass visualized. Endometrium Thickness: 10 mm. IUD is partially visualized in the fundal endometrium. Right ovary Measurements: 2.7 x 2.1 x 1.9 cm = volume: 6 mL. Normal appearance/no adnexal mass. Left ovary Measurements: 5.0 x 4.9 x 5.7 cm = volume: 72 mL. There is a 4.8 x 3.9 x 5.0 cm anechoic cyst with septation in the left ovary (this previously measured 4.7 cm) Pulsed Doppler evaluation demonstrates normal low-resistance arterial and venous waveforms in both ovaries. Other: There is trace free fluid in the pelvis. IMPRESSION: 1. Vascular flow is identified in both ovaries. 2. Unchanged 5.0 cm left ovarian cyst with septation. Recommend follow-up ultrasound in 6-12 weeks to ensure resolution. 3. IUD is partially visualized in the fundal endometrium. 4. Trace free fluid in the pelvis. Electronically Signed   By: Greig Pique M.D.   On: 08/07/2024 15:43      Medications Ordered in the ED  ondansetron  (ZOFRAN ) injection 4 mg (4 mg Intravenous Given 08/07/24 1233)  HYDROmorphone  (DILAUDID ) injection 0.5 mg (0.5 mg Intravenous Given 08/07/24 1241)   ketorolac  (TORADOL ) 15 MG/ML injection 15 mg (15 mg Intravenous Given 08/07/24 1604)                                   Medical Decision Making Amount and/or Complexity of Data Reviewed Labs: ordered. Radiology: ordered.  Risk Prescription drug management.   Patient is a 23 year old female who presents to the ED for increasing lower abdominal pain for the past week.  Please see detailed HPI above.  Of note patient was seen at the ED 3 days prior and told she had a left-sided ovarian cyst.  Notes pain has continued to get worse prompting her to return.  On exam patient is alert and in no acute distress.  Physical exam as noted above.  Generalized tenderness appreciated that is worse in the left lower quadrant.  No obvious  masses or distention noted.  Differential includes gastritis, IBS, IBD, constipation, ovarian cyst, ovarian torsion, tubo-ovarian abscess, PID.  Workup from 3 days prior reviewed that did show a left-sided ovarian cyst on ultrasound.  This is approximately 6 cm and complex.  An outpatient MRI was recommended.  Patient notes she did try to call PCP this morning who told her they did not do outpatient MRIs so she did return to the ED for increasing pain.  CBC, CMP, lipase unremarkable.  Pregnancy negative.  Ultrasound of the pelvis again today redemonstrates a 5 cm left-sided ovarian cyst but good blood flow to both sides, no acute signs of ovarian torsion.  Less concerns for abscess with no leukocytosis noted.  Suspect patient symptoms are secondary to ovarian cyst but less concerns for torsion with reassuring imaging today.  Torsion does not appear to be intermittent either this pain has been pretty consistent for 1 week.  Patient was given Dilaudid  and Toradol  while in ED with some improvement in pain.  Stable for discharge home.  Prescribe naproxen .  Advised to call gynecology to schedule follow-up appointment.  Patient understands that nonemergent MRI could be ordered  outpatient as needed.  Strict return precautions provided for worsening symptoms.     Final diagnoses:  Left lower quadrant pain  Cyst of left ovary    ED Discharge Orders          Ordered    naproxen  (NAPROSYN ) 500 MG tablet  2 times daily        08/07/24 1555               Neysa Thersia RAMAN, [place] 08/07/24 1629    Rogelia Jerilynn RAMAN, MD 08/15/24 1220

## 2024-08-07 NOTE — ED Notes (Addendum)
 Pt states she does not want to get into a gown.

## 2024-08-07 NOTE — Telephone Encounter (Signed)
   Copied from CRM [id number]. Topic: Clinical - Red Word Triage >> Aug 07, 2024 10:45 AM Tinnie BROCKS wrote: Red Word that prompted transfer to Nurse Triage: Severe cramping/abdominal pain. Recently dxed with large cyst, currently at [facility] and went to ER as well 11/28. Threw up from pain and has been missing work.She wants to request she be referred for MRI. #[id number]

## 2024-08-07 NOTE — Discharge Instructions (Addendum)
 May take naproxen twice a day as needed for pain.  Do not take extra ibuprofen at the same time.  Apply heat pads to the area as well to help with pain.  Please gynecology to schedule follow-up appointment for further evaluation and management of ovarian cyst.  Return to ED sooner if any symptoms worsen including severe uncontrollable pain, uncontrolled nausea/vomiting, new fevers.

## 2024-08-07 NOTE — Telephone Encounter (Signed)
 Patients initial reason for calling has resolved, now calling to establish care.  Appointment scheduled for 09/28/2024   Reason for Disposition  Requesting regular office appointment  Protocols used: Information Only Call - No Triage-A-AH

## 2024-08-07 NOTE — ED Triage Notes (Signed)
 Abd pain. Denies vaginal bleeding. Was seen recently here and diagnosed with left ovarian cyst. Was instructed to go 1 of 2 locations for MRI. Was told they were unable to perform the MRI in the office.

## 2024-09-28 ENCOUNTER — Encounter: Payer: Self-pay | Admitting: Family Medicine

## 2024-09-28 ENCOUNTER — Ambulatory Visit: Admitting: Family Medicine

## 2024-09-28 VITALS — BP 108/68 | HR 70 | Temp 98.1°F | Ht 65.0 in | Wt 179.0 lb

## 2024-09-28 DIAGNOSIS — K219 Gastro-esophageal reflux disease without esophagitis: Secondary | ICD-10-CM | POA: Diagnosis not present

## 2024-09-28 DIAGNOSIS — E663 Overweight: Secondary | ICD-10-CM | POA: Diagnosis not present

## 2024-09-28 DIAGNOSIS — Z7689 Persons encountering health services in other specified circumstances: Secondary | ICD-10-CM

## 2024-09-28 DIAGNOSIS — Z1159 Encounter for screening for other viral diseases: Secondary | ICD-10-CM | POA: Diagnosis not present

## 2024-09-28 DIAGNOSIS — E559 Vitamin D deficiency, unspecified: Secondary | ICD-10-CM | POA: Diagnosis not present

## 2024-09-28 DIAGNOSIS — R079 Chest pain, unspecified: Secondary | ICD-10-CM

## 2024-09-28 NOTE — Progress Notes (Unsigned)
 "  New Patient Office Visit  Subjective   Patient ID: Tami Bauer, female    DOB: 2001/03/02  Age: 24 y.o. MRN: [id number]  CC:  Chief Complaint  Patient presents with   Establish Care    HPI Tami Bauer presents to establish care with new provider.  Patients previous primary care: [facility] with Kathlene Fear, PA   Specialist: [facility] with Dr. Leita Hoyer  [facility]-intermittent   She reports she is have severe GI symptoms, including intermittent chest pain that is being managed by [facility] in [place]. She is having an endoscopy tomorrow. She reports she has lost a significant amount of weight with not being able to eat due to her GI symptoms. With losing weight, she has noticed pain at her lower rib cage and diaphragm area with concerns of more noticeable bump on her left upper abdomen. Started around the first week in January. Denies any shortness of breath.   Outpatient Encounter Medications as of 09/28/2024  Medication Sig   Ascorbic Acid (VITAMIN C GUMMIE) 120 MG CHEW Chew by mouth.   levonorgestrel (MIRENA) 20 MCG/DAY IUD 1 each by Intrauterine route once.   omeprazole  (PRILOSEC) 40 MG capsule Take 1 capsule (40 mg total) by mouth daily.   ondansetron  (ZOFRAN ) 8 MG tablet Take 1 tablet (8 mg total) by mouth every 8 (eight) hours as needed for nausea or vomiting.   [DISCONTINUED] famotidine  (PEPCID ) 40 MG tablet Take 1 tablet (40 mg total) by mouth at bedtime.   [DISCONTINUED] acetaminophen  (TYLENOL ) 500 MG tablet Take 500-1,000 mg by mouth every 6 (six) hours as needed for mild pain or headache.   [DISCONTINUED] cariprazine  (VRAYLAR ) capsule Take 1.5 mg by mouth See admin instructions. Take 1.5 mg by mouth every other morning   [DISCONTINUED] enoxaparin  (LOVENOX ) 40 MG/0.4ML injection Inject 0.4 mLs (40 mg total) into the skin daily.   [DISCONTINUED] fluvoxaMINE   (LUVOX ) 100 MG tablet Take 150 mg by mouth at bedtime.    [DISCONTINUED] JUNEL  1/20 1-20 MG-MCG tablet Take 1 tablet by mouth at bedtime.    [DISCONTINUED] naproxen  (NAPROSYN ) 500 MG tablet Take 1 tablet (500 mg total) by mouth 2 (two) times daily.   [DISCONTINUED] polyethylene glycol powder (GLYCOLAX /MIRALAX ) 17 GM/SCOOP powder Take 17 g by mouth 2 (two) times daily as needed. (Patient taking differently: Take 17 g by mouth 2 (two) times daily as needed for mild constipation (MIX AND DRINK).)   [DISCONTINUED] VYVANSE 50 MG capsule Take 50 mg by mouth in the morning.   No facility-administered encounter medications on file as of 09/28/2024.    Past Medical History:  Diagnosis Date   ADHD    Allergy    Anxiety    [employer]rees[employer] 01/30/2020   GERD (gastroesophageal reflux disease)    [employer]reesia 01/30/2020    Past Surgical History:  Procedure Laterality Date   ADENOIDECTOMY     SACRO-ILIAC PINNING Left 05/09/2020   Procedure: SACRO-ILIAC PINNING;  Surgeon: Kendal Franky SQUIBB, MD;  Location: [facility] OR;  Service: Orthopedics;  Laterality: Left;   WISDOM TOOTH EXTRACTION      Family History  Problem Relation Age of Onset   Mental illness Mother    Cholelithiasis Mother    Rheum arthritis Maternal Uncle    Diabetes Maternal Grandmother    Leukemia Maternal Grandfather    Breast cancer Paternal Grandmother    Dementia Paternal Grandmother    Breast cancer Paternal Grandfather  Dementia Paternal Grandfather     Social History   Socioeconomic History   Marital status: Single    Spouse name: Not on file   Number of children: 0   Years of education: Not on file   Highest education level: Some college, no degree  Occupational History   Not on file  Tobacco Use   Smoking status: Some Days    Types: Cigarettes   Smokeless tobacco: Never  Vaping Use   Vaping status: Never Used  Substance and Sexual Activity   Alcohol use: Not Currently   Drug use: Yes    Types: Marijuana    Comment: Been  2 months   Sexual activity: Yes    Birth control/protection: I.U.D., Condom  Other Topics Concern   Not on file  Social History Narrative   Not on file   Social Drivers of Health   Tobacco Use: High Risk (09/28/2024)   Patient History    Smoking Tobacco Use: Some Days    Smokeless Tobacco Use: Never    Passive Exposure: Not on file  Financial Resource Strain: Low Risk (09/11/2024)   Received from [facility]   Overall Financial Resource Strain (CARDIA)    How hard is it for you to pay for the very basics like food, housing, medical care, and heating?: Not hard at all  Recent Concern: Financial Resource Strain - Medium Risk (08/13/2024)   Received from [facility]   Overall Financial Resource Strain (CARDIA)    How hard is it for you to pay for the very basics like food, housing, medical care, and heating?: Somewhat hard  Food Insecurity: No Food Insecurity (09/11/2024)   Received from [facility]   Epic    Within the past 12 months, you worried that your food would run out before you got the money to buy more.: Never true    Within the past 12 months, the food you bought just didn't last and you didn't have money to get more.: Never true  Recent Concern: Food Insecurity - Food Insecurity Present (08/13/2024)   Received from [facility]   Epic    Within the past 12 months, you worried that your food would run out before you got the money to buy more.: Never true    Within the past 12 months, the food you bought just didn't last and you didn't have money to get more.: Sometimes true  Transportation Needs: No Transportation Needs (09/11/2024)   Received from [facility]    In the past 12 months, has lack of transportation kept you from medical appointments or from getting medications?: No    In the past 12 months, has lack of transportation kept you from meetings, work, or from getting things needed for daily living?: No  Physical Activity: Inactive (08/13/2024)   Received  from [facility]   Exercise Vital Sign    On average, how many days per week do you engage in moderate to strenuous exercise (like a brisk walk)?: 0 days    Minutes of Exercise per Session: Not on file  Stress: No Stress Concern Present (08/13/2024)   Received from [facility] - Occupational Stress Questionnaire    Do you feel stress - tense, restless, nervous, or anxious, or unable to sleep at night because your mind is troubled all the time - these days?: Only a little  Social Connections: Socially Integrated (08/13/2024)   Received from [facility]  Social Network    How would you rate your social network (family, work, friends)?: Good participation with social networks  Intimate Partner Violence: Not At Risk (08/13/2024)   Received from [facility]   HITS    Over the last 12 months how often did your partner physically hurt you?: Never    Over the last 12 months how often did your partner insult you or talk down to you?: Never    Over the last 12 months how often did your partner threaten you with physical harm?: Never    Over the last 12 months how often did your partner scream or curse at you?: Never  Depression (PHQ2-9): Medium Risk (09/28/2024)   Depression (PHQ2-9)    PHQ-2 Score: 6  Alcohol Screen: Low Risk (09/28/2024)   Alcohol Screen    Last Alcohol Screening Score (AUDIT): 0  Housing: Low Risk (09/11/2024)   Received from [facility]    In the last 12 months, was there a time when you were not able to pay the mortgage or rent on time?: No    In the past 12 months, how many times have you moved where you were living?: 1    At any time in the past 12 months, were you homeless or living in a shelter (including now)?: No  Utilities: Not At Risk (09/11/2024)   Received from [facility]    In the past 12 months has the electric, gas, oil, or water company threatened to shut off services in your home?: No  Recent  Concern: Utilities - At Risk (08/13/2024)   Received from [facility]    In the past 12 months has the electric, gas, oil, or water company threatened to shut off services in your home?: Yes  Health Literacy: Adequate Health Literacy (09/28/2024)   B1300 Health Literacy    Frequency of need for help with medical instructions: Never    ROS See HPI above    Objective  BP 108/68   Pulse 70   Temp 98.1 F (36.7 C) (Oral)   Ht 5' 5 (1.651 m)   Wt 179 lb (81.2 kg)   SpO2 97%   BMI 29.79 kg/m   Physical Exam Vitals reviewed.  Constitutional:      General: Hanifa is not in acute distress.    Appearance: Normal appearance. Agustina is overweight. Twinkle is not ill-appearing, toxic-appearing or diaphoretic.  HENT:     Head: Normocephalic and atraumatic.  Eyes:     General:        Right eye: No discharge.        Left eye: No discharge.     Conjunctiva/sclera: Conjunctivae normal.  Cardiovascular:     Rate and Rhythm: Normal rate and regular rhythm.     Heart sounds: Normal heart sounds. No murmur heard.    No friction rub. No gallop.  Pulmonary:     Effort: Pulmonary effort is normal. No respiratory distress.     Breath sounds: Normal breath sounds.  Musculoskeletal:        General: Normal range of motion.  Skin:    General: Skin is warm and dry.  Neurological:     General: No focal deficit present.     Mental Status: Walterine is alert and oriented to person, place, and time. Mental status is at baseline.  Psychiatric:        Mood and Affect: Mood normal.  Behavior: Behavior normal.        Thought Content: Thought content normal.        Judgment: Judgment normal.      Assessment & Plan:  Overweight (BMI 25.0-29.9) -     Hemoglobin A1c; Future -     Lipid panel; Future -     TSH; Future  Vitamin D deficiency -     VITAMIN D 25 Hydroxy (Vit-D Deficiency, Fractures); Future  Need for hepatitis C screening test -     Hepatitis C antibody; Future  Chest pain  due to gastrointestinal reflux disease  Encounter to establish care  1.Review health maintenance:  -Hep B vaccine: Allergic  -HPV vaccine: Not had  -Hep C screening: Order  -PNA vaccine: Not had  -Cervical cancer screening: Will schedule appointment GYN  2.Discussed about GI symptoms with concern of rib cage pain and bump on left upper abd and chest pain that only occurs with other GI symptoms. Recommend to follow up with GI recommendations based on symptoms and findings from endoscopy. Will hold on further work up of chest pain since it occurs only with other GI symptoms. If not improved, follow up. Based on physical exam with weight loss, rib cage and diaphragm are more noticeable.  3.Ordered labs based on BMI and screening. Schedule a lab appointment when able to fast. Office will call with lab results and will be available via MyChart.   Return in about 1 year (around 09/28/2025) for physical; lab-fasting .   Jannely Henthorn, NP "

## 2024-09-29 NOTE — Patient Instructions (Addendum)
-  It was nice to meet you and look forward to taking care of you. -Recommend to schedule appointment with GYN for pap smear (cervical cancer screening).  -Discussed about GI symptoms with concern of rib cage pain and bump on left upper abd and chest pain that only occurs with other GI symptoms. Recommend to follow up with GI recommendations based on symptoms and findings from endoscopy. Will hold on further work up of chest pain since it occurs only with other GI symptoms. If not improved, follow up. Based on physical exam with weight loss, rib cage and diaphragm are more noticeable.  -Ordered labs based on BMI and screening. Schedule a lab appointment when able to fast. Office will call with lab results and will be available via MyChart.  -Follow up in 1 year for a physical.

## 2024-10-09 ENCOUNTER — Other Ambulatory Visit
